# Patient Record
Sex: Female | Born: 1940 | Race: White | Hispanic: No | Smoking: Never smoker
Health system: Southern US, Community
[De-identification: ages and names within clinical notes are randomized; demographics above are authoritative.]

## PROBLEM LIST (undated history)

## (undated) DIAGNOSIS — M1990 Unspecified osteoarthritis, unspecified site: Secondary | ICD-10-CM

## (undated) DIAGNOSIS — R32 Unspecified urinary incontinence: Secondary | ICD-10-CM

## (undated) DIAGNOSIS — Z5189 Encounter for other specified aftercare: Secondary | ICD-10-CM

## (undated) DIAGNOSIS — Z9889 Other specified postprocedural states: Secondary | ICD-10-CM

## (undated) DIAGNOSIS — C801 Malignant (primary) neoplasm, unspecified: Secondary | ICD-10-CM

## (undated) DIAGNOSIS — B159 Hepatitis A without hepatic coma: Secondary | ICD-10-CM

## (undated) DIAGNOSIS — N390 Urinary tract infection, site not specified: Secondary | ICD-10-CM

## (undated) DIAGNOSIS — I10 Essential (primary) hypertension: Secondary | ICD-10-CM

## (undated) DIAGNOSIS — I219 Acute myocardial infarction, unspecified: Secondary | ICD-10-CM

## (undated) DIAGNOSIS — Z9049 Acquired absence of other specified parts of digestive tract: Secondary | ICD-10-CM

## (undated) DIAGNOSIS — K219 Gastro-esophageal reflux disease without esophagitis: Secondary | ICD-10-CM

## (undated) DIAGNOSIS — B019 Varicella without complication: Secondary | ICD-10-CM

## (undated) HISTORY — DX: Acute myocardial infarction, unspecified: I21.9

## (undated) HISTORY — PX: LIVER RESECTION: SHX1977

## (undated) HISTORY — DX: Essential (primary) hypertension: I10

## (undated) HISTORY — DX: Unspecified osteoarthritis, unspecified site: M19.90

## (undated) HISTORY — PX: SHOULDER SURGERY: SHX246

## (undated) HISTORY — DX: Hepatitis a without hepatic coma: B15.9

## (undated) HISTORY — PX: ABDOMINAL HYSTERECTOMY: SHX81

## (undated) HISTORY — PX: APPENDECTOMY: SHX54

## (undated) HISTORY — DX: Urinary tract infection, site not specified: N39.0

## (undated) HISTORY — PX: FOREIGN BODY REMOVAL: SHX962

## (undated) HISTORY — PX: SMALL INTESTINE SURGERY: SHX150

## (undated) HISTORY — PX: CHOLECYSTECTOMY: SHX55

## (undated) HISTORY — DX: Encounter for other specified aftercare: Z51.89

## (undated) HISTORY — DX: Varicella without complication: B01.9

## (undated) HISTORY — DX: Gastro-esophageal reflux disease without esophagitis: K21.9

## (undated) HISTORY — PX: CERVICAL SPINE SURGERY: SHX589

## (undated) HISTORY — DX: Unspecified urinary incontinence: R32

## (undated) HISTORY — PX: BREAST SURGERY: SHX581

---

## 2002-03-22 DIAGNOSIS — M2010 Hallux valgus (acquired), unspecified foot: Secondary | ICD-10-CM | POA: Insufficient documentation

## 2006-04-15 DIAGNOSIS — Z8505 Personal history of malignant neoplasm of liver: Secondary | ICD-10-CM | POA: Insufficient documentation

## 2006-04-15 DIAGNOSIS — M810 Age-related osteoporosis without current pathological fracture: Secondary | ICD-10-CM | POA: Insufficient documentation

## 2006-07-09 DIAGNOSIS — M545 Low back pain, unspecified: Secondary | ICD-10-CM | POA: Insufficient documentation

## 2006-07-09 DIAGNOSIS — K219 Gastro-esophageal reflux disease without esophagitis: Secondary | ICD-10-CM | POA: Insufficient documentation

## 2007-10-07 DIAGNOSIS — T192XXA Foreign body in vulva and vagina, initial encounter: Secondary | ICD-10-CM | POA: Insufficient documentation

## 2007-12-23 DIAGNOSIS — C7A019 Malignant carcinoid tumor of the small intestine, unspecified portion: Secondary | ICD-10-CM

## 2008-11-28 DIAGNOSIS — M79609 Pain in unspecified limb: Secondary | ICD-10-CM | POA: Insufficient documentation

## 2009-10-12 DIAGNOSIS — L304 Erythema intertrigo: Secondary | ICD-10-CM | POA: Insufficient documentation

## 2009-10-12 DIAGNOSIS — N390 Urinary tract infection, site not specified: Secondary | ICD-10-CM | POA: Insufficient documentation

## 2010-07-02 DIAGNOSIS — R221 Localized swelling, mass and lump, neck: Secondary | ICD-10-CM | POA: Insufficient documentation

## 2010-07-02 DIAGNOSIS — H8190 Unspecified disorder of vestibular function, unspecified ear: Secondary | ICD-10-CM | POA: Insufficient documentation

## 2010-07-02 DIAGNOSIS — G501 Atypical facial pain: Secondary | ICD-10-CM | POA: Insufficient documentation

## 2012-01-02 DIAGNOSIS — I2510 Atherosclerotic heart disease of native coronary artery without angina pectoris: Secondary | ICD-10-CM | POA: Insufficient documentation

## 2012-01-03 DIAGNOSIS — Z9889 Other specified postprocedural states: Secondary | ICD-10-CM | POA: Insufficient documentation

## 2012-06-12 DIAGNOSIS — T84099A Other mechanical complication of unspecified internal joint prosthesis, initial encounter: Secondary | ICD-10-CM | POA: Insufficient documentation

## 2015-08-10 DIAGNOSIS — M5412 Radiculopathy, cervical region: Secondary | ICD-10-CM | POA: Insufficient documentation

## 2015-09-11 DIAGNOSIS — F411 Generalized anxiety disorder: Secondary | ICD-10-CM | POA: Insufficient documentation

## 2015-09-26 DIAGNOSIS — F4323 Adjustment disorder with mixed anxiety and depressed mood: Secondary | ICD-10-CM | POA: Diagnosis not present

## 2015-09-27 DIAGNOSIS — K5289 Other specified noninfective gastroenteritis and colitis: Secondary | ICD-10-CM | POA: Diagnosis not present

## 2015-09-27 DIAGNOSIS — D490 Neoplasm of unspecified behavior of digestive system: Secondary | ICD-10-CM | POA: Diagnosis not present

## 2015-09-27 DIAGNOSIS — R1031 Right lower quadrant pain: Secondary | ICD-10-CM | POA: Diagnosis not present

## 2015-09-27 DIAGNOSIS — R1011 Right upper quadrant pain: Secondary | ICD-10-CM | POA: Diagnosis not present

## 2015-09-27 DIAGNOSIS — R142 Eructation: Secondary | ICD-10-CM | POA: Diagnosis not present

## 2015-09-27 DIAGNOSIS — M542 Cervicalgia: Secondary | ICD-10-CM | POA: Diagnosis not present

## 2015-09-29 DIAGNOSIS — Z8589 Personal history of malignant neoplasm of other organs and systems: Secondary | ICD-10-CM | POA: Diagnosis not present

## 2015-09-29 DIAGNOSIS — R1031 Right lower quadrant pain: Secondary | ICD-10-CM | POA: Diagnosis not present

## 2015-09-29 DIAGNOSIS — S72001A Fracture of unspecified part of neck of right femur, initial encounter for closed fracture: Secondary | ICD-10-CM | POA: Diagnosis not present

## 2015-09-29 DIAGNOSIS — Z9049 Acquired absence of other specified parts of digestive tract: Secondary | ICD-10-CM | POA: Diagnosis not present

## 2015-09-29 DIAGNOSIS — R197 Diarrhea, unspecified: Secondary | ICD-10-CM | POA: Diagnosis not present

## 2015-09-29 DIAGNOSIS — R1011 Right upper quadrant pain: Secondary | ICD-10-CM | POA: Diagnosis not present

## 2015-09-29 DIAGNOSIS — R142 Eructation: Secondary | ICD-10-CM | POA: Diagnosis not present

## 2015-10-03 DIAGNOSIS — F4323 Adjustment disorder with mixed anxiety and depressed mood: Secondary | ICD-10-CM | POA: Diagnosis not present

## 2015-10-03 DIAGNOSIS — R1031 Right lower quadrant pain: Secondary | ICD-10-CM | POA: Diagnosis not present

## 2015-10-03 DIAGNOSIS — R142 Eructation: Secondary | ICD-10-CM | POA: Diagnosis not present

## 2015-10-03 DIAGNOSIS — R1011 Right upper quadrant pain: Secondary | ICD-10-CM | POA: Diagnosis not present

## 2015-10-03 DIAGNOSIS — K5289 Other specified noninfective gastroenteritis and colitis: Secondary | ICD-10-CM | POA: Diagnosis not present

## 2015-10-03 DIAGNOSIS — D490 Neoplasm of unspecified behavior of digestive system: Secondary | ICD-10-CM | POA: Diagnosis not present

## 2015-10-10 DIAGNOSIS — F4323 Adjustment disorder with mixed anxiety and depressed mood: Secondary | ICD-10-CM | POA: Diagnosis not present

## 2015-10-14 DIAGNOSIS — B9689 Other specified bacterial agents as the cause of diseases classified elsewhere: Secondary | ICD-10-CM | POA: Diagnosis not present

## 2015-10-14 DIAGNOSIS — R350 Frequency of micturition: Secondary | ICD-10-CM | POA: Diagnosis not present

## 2015-10-14 DIAGNOSIS — N760 Acute vaginitis: Secondary | ICD-10-CM | POA: Diagnosis not present

## 2015-10-14 DIAGNOSIS — N3000 Acute cystitis without hematuria: Secondary | ICD-10-CM | POA: Diagnosis not present

## 2015-10-17 DIAGNOSIS — F4323 Adjustment disorder with mixed anxiety and depressed mood: Secondary | ICD-10-CM | POA: Diagnosis not present

## 2015-10-17 DIAGNOSIS — M25512 Pain in left shoulder: Secondary | ICD-10-CM | POA: Diagnosis not present

## 2015-10-17 DIAGNOSIS — G8929 Other chronic pain: Secondary | ICD-10-CM | POA: Diagnosis not present

## 2015-10-24 DIAGNOSIS — F4323 Adjustment disorder with mixed anxiety and depressed mood: Secondary | ICD-10-CM | POA: Diagnosis not present

## 2015-10-31 DIAGNOSIS — F4323 Adjustment disorder with mixed anxiety and depressed mood: Secondary | ICD-10-CM | POA: Diagnosis not present

## 2015-11-02 DIAGNOSIS — M25512 Pain in left shoulder: Secondary | ICD-10-CM | POA: Diagnosis not present

## 2015-11-06 DIAGNOSIS — M25512 Pain in left shoulder: Secondary | ICD-10-CM | POA: Diagnosis not present

## 2015-11-07 DIAGNOSIS — F4323 Adjustment disorder with mixed anxiety and depressed mood: Secondary | ICD-10-CM | POA: Diagnosis not present

## 2015-11-08 DIAGNOSIS — M25512 Pain in left shoulder: Secondary | ICD-10-CM | POA: Diagnosis not present

## 2015-11-09 DIAGNOSIS — M25512 Pain in left shoulder: Secondary | ICD-10-CM | POA: Diagnosis not present

## 2015-11-10 DIAGNOSIS — M25512 Pain in left shoulder: Secondary | ICD-10-CM | POA: Diagnosis not present

## 2015-11-12 DIAGNOSIS — N390 Urinary tract infection, site not specified: Secondary | ICD-10-CM | POA: Diagnosis not present

## 2015-11-12 DIAGNOSIS — N3000 Acute cystitis without hematuria: Secondary | ICD-10-CM | POA: Diagnosis not present

## 2015-11-12 DIAGNOSIS — B9689 Other specified bacterial agents as the cause of diseases classified elsewhere: Secondary | ICD-10-CM | POA: Diagnosis not present

## 2015-11-13 DIAGNOSIS — M25512 Pain in left shoulder: Secondary | ICD-10-CM | POA: Diagnosis not present

## 2015-11-15 DIAGNOSIS — R180 Malignant ascites: Secondary | ICD-10-CM | POA: Diagnosis not present

## 2015-11-15 DIAGNOSIS — Z96641 Presence of right artificial hip joint: Secondary | ICD-10-CM | POA: Diagnosis present

## 2015-11-15 DIAGNOSIS — Z90710 Acquired absence of both cervix and uterus: Secondary | ICD-10-CM | POA: Diagnosis not present

## 2015-11-15 DIAGNOSIS — I2510 Atherosclerotic heart disease of native coronary artery without angina pectoris: Secondary | ICD-10-CM | POA: Diagnosis not present

## 2015-11-15 DIAGNOSIS — Z96612 Presence of left artificial shoulder joint: Secondary | ICD-10-CM | POA: Diagnosis present

## 2015-11-15 DIAGNOSIS — Z8249 Family history of ischemic heart disease and other diseases of the circulatory system: Secondary | ICD-10-CM | POA: Diagnosis not present

## 2015-11-15 DIAGNOSIS — Z882 Allergy status to sulfonamides status: Secondary | ICD-10-CM | POA: Diagnosis not present

## 2015-11-15 DIAGNOSIS — I252 Old myocardial infarction: Secondary | ICD-10-CM | POA: Diagnosis not present

## 2015-11-15 DIAGNOSIS — R1110 Vomiting, unspecified: Secondary | ICD-10-CM | POA: Diagnosis not present

## 2015-11-15 DIAGNOSIS — E860 Dehydration: Secondary | ICD-10-CM | POA: Diagnosis not present

## 2015-11-15 DIAGNOSIS — Z9049 Acquired absence of other specified parts of digestive tract: Secondary | ICD-10-CM | POA: Diagnosis not present

## 2015-11-15 DIAGNOSIS — Z85060 Personal history of malignant carcinoid tumor of small intestine: Secondary | ICD-10-CM | POA: Diagnosis not present

## 2015-11-15 DIAGNOSIS — C800 Disseminated malignant neoplasm, unspecified: Secondary | ICD-10-CM | POA: Diagnosis not present

## 2015-11-15 DIAGNOSIS — Z792 Long term (current) use of antibiotics: Secondary | ICD-10-CM | POA: Diagnosis not present

## 2015-11-15 DIAGNOSIS — Z7982 Long term (current) use of aspirin: Secondary | ICD-10-CM | POA: Diagnosis not present

## 2015-11-15 DIAGNOSIS — K7689 Other specified diseases of liver: Secondary | ICD-10-CM | POA: Diagnosis not present

## 2015-11-15 DIAGNOSIS — K565 Intestinal adhesions [bands] with obstruction (postprocedural) (postinfection): Secondary | ICD-10-CM | POA: Diagnosis present

## 2015-11-15 DIAGNOSIS — K760 Fatty (change of) liver, not elsewhere classified: Secondary | ICD-10-CM | POA: Diagnosis not present

## 2015-11-15 DIAGNOSIS — K5669 Other intestinal obstruction: Secondary | ICD-10-CM | POA: Diagnosis not present

## 2015-11-15 DIAGNOSIS — K5660 Unspecified intestinal obstruction: Secondary | ICD-10-CM | POA: Diagnosis not present

## 2015-11-15 DIAGNOSIS — R197 Diarrhea, unspecified: Secondary | ICD-10-CM | POA: Diagnosis not present

## 2015-11-15 DIAGNOSIS — Z79899 Other long term (current) drug therapy: Secondary | ICD-10-CM | POA: Diagnosis not present

## 2015-11-15 DIAGNOSIS — I10 Essential (primary) hypertension: Secondary | ICD-10-CM | POA: Diagnosis not present

## 2015-11-15 DIAGNOSIS — Z825 Family history of asthma and other chronic lower respiratory diseases: Secondary | ICD-10-CM | POA: Diagnosis not present

## 2015-11-15 DIAGNOSIS — Z8505 Personal history of malignant neoplasm of liver: Secondary | ICD-10-CM | POA: Diagnosis not present

## 2015-11-15 DIAGNOSIS — D72829 Elevated white blood cell count, unspecified: Secondary | ICD-10-CM | POA: Diagnosis not present

## 2015-11-20 DIAGNOSIS — I252 Old myocardial infarction: Secondary | ICD-10-CM | POA: Insufficient documentation

## 2015-11-21 DIAGNOSIS — F4323 Adjustment disorder with mixed anxiety and depressed mood: Secondary | ICD-10-CM | POA: Diagnosis not present

## 2015-11-22 DIAGNOSIS — Z1211 Encounter for screening for malignant neoplasm of colon: Secondary | ICD-10-CM | POA: Diagnosis not present

## 2015-11-22 DIAGNOSIS — I10 Essential (primary) hypertension: Secondary | ICD-10-CM | POA: Diagnosis not present

## 2015-11-22 DIAGNOSIS — R5383 Other fatigue: Secondary | ICD-10-CM | POA: Diagnosis not present

## 2015-11-22 DIAGNOSIS — Z1212 Encounter for screening for malignant neoplasm of rectum: Secondary | ICD-10-CM | POA: Diagnosis not present

## 2015-11-22 DIAGNOSIS — M25512 Pain in left shoulder: Secondary | ICD-10-CM | POA: Diagnosis not present

## 2015-11-22 DIAGNOSIS — G8929 Other chronic pain: Secondary | ICD-10-CM | POA: Diagnosis not present

## 2015-11-22 DIAGNOSIS — Z0000 Encounter for general adult medical examination without abnormal findings: Secondary | ICD-10-CM | POA: Diagnosis not present

## 2015-11-22 DIAGNOSIS — Z09 Encounter for follow-up examination after completed treatment for conditions other than malignant neoplasm: Secondary | ICD-10-CM | POA: Diagnosis not present

## 2015-11-28 DIAGNOSIS — F4323 Adjustment disorder with mixed anxiety and depressed mood: Secondary | ICD-10-CM | POA: Diagnosis not present

## 2015-11-28 DIAGNOSIS — M25512 Pain in left shoulder: Secondary | ICD-10-CM | POA: Diagnosis not present

## 2015-11-28 DIAGNOSIS — G243 Spasmodic torticollis: Secondary | ICD-10-CM | POA: Diagnosis not present

## 2015-11-28 DIAGNOSIS — Y92018 Other place in single-family (private) house as the place of occurrence of the external cause: Secondary | ICD-10-CM | POA: Diagnosis not present

## 2015-11-28 DIAGNOSIS — W108XXA Fall (on) (from) other stairs and steps, initial encounter: Secondary | ICD-10-CM | POA: Diagnosis not present

## 2015-11-28 DIAGNOSIS — Z96612 Presence of left artificial shoulder joint: Secondary | ICD-10-CM | POA: Diagnosis not present

## 2015-11-29 DIAGNOSIS — M25512 Pain in left shoulder: Secondary | ICD-10-CM | POA: Diagnosis not present

## 2015-11-29 DIAGNOSIS — I10 Essential (primary) hypertension: Secondary | ICD-10-CM | POA: Diagnosis not present

## 2015-11-29 DIAGNOSIS — M545 Low back pain: Secondary | ICD-10-CM | POA: Diagnosis not present

## 2015-11-29 DIAGNOSIS — M810 Age-related osteoporosis without current pathological fracture: Secondary | ICD-10-CM | POA: Diagnosis not present

## 2015-11-29 DIAGNOSIS — G8929 Other chronic pain: Secondary | ICD-10-CM | POA: Diagnosis not present

## 2015-11-30 DIAGNOSIS — R5383 Other fatigue: Secondary | ICD-10-CM | POA: Diagnosis not present

## 2015-11-30 DIAGNOSIS — M542 Cervicalgia: Secondary | ICD-10-CM | POA: Diagnosis not present

## 2015-11-30 DIAGNOSIS — I10 Essential (primary) hypertension: Secondary | ICD-10-CM | POA: Diagnosis not present

## 2015-11-30 DIAGNOSIS — M810 Age-related osteoporosis without current pathological fracture: Secondary | ICD-10-CM | POA: Diagnosis not present

## 2015-12-02 DIAGNOSIS — Z9049 Acquired absence of other specified parts of digestive tract: Secondary | ICD-10-CM | POA: Diagnosis not present

## 2015-12-02 DIAGNOSIS — Z8719 Personal history of other diseases of the digestive system: Secondary | ICD-10-CM | POA: Diagnosis not present

## 2015-12-02 DIAGNOSIS — R1011 Right upper quadrant pain: Secondary | ICD-10-CM | POA: Diagnosis not present

## 2015-12-02 DIAGNOSIS — R630 Anorexia: Secondary | ICD-10-CM | POA: Diagnosis not present

## 2015-12-04 DIAGNOSIS — L905 Scar conditions and fibrosis of skin: Secondary | ICD-10-CM | POA: Diagnosis not present

## 2015-12-05 DIAGNOSIS — C7A019 Malignant carcinoid tumor of the small intestine, unspecified portion: Secondary | ICD-10-CM | POA: Diagnosis not present

## 2015-12-05 DIAGNOSIS — I491 Atrial premature depolarization: Secondary | ICD-10-CM | POA: Diagnosis not present

## 2015-12-05 DIAGNOSIS — F411 Generalized anxiety disorder: Secondary | ICD-10-CM | POA: Diagnosis not present

## 2015-12-05 DIAGNOSIS — K5669 Other intestinal obstruction: Secondary | ICD-10-CM | POA: Diagnosis not present

## 2015-12-05 DIAGNOSIS — Z09 Encounter for follow-up examination after completed treatment for conditions other than malignant neoplasm: Secondary | ICD-10-CM | POA: Diagnosis not present

## 2015-12-05 DIAGNOSIS — M810 Age-related osteoporosis without current pathological fracture: Secondary | ICD-10-CM | POA: Diagnosis not present

## 2015-12-06 DIAGNOSIS — Z01812 Encounter for preprocedural laboratory examination: Secondary | ICD-10-CM | POA: Diagnosis not present

## 2015-12-11 DIAGNOSIS — Z01810 Encounter for preprocedural cardiovascular examination: Secondary | ICD-10-CM | POA: Diagnosis not present

## 2015-12-11 DIAGNOSIS — R000 Tachycardia, unspecified: Secondary | ICD-10-CM | POA: Diagnosis not present

## 2015-12-11 DIAGNOSIS — R0789 Other chest pain: Secondary | ICD-10-CM | POA: Diagnosis not present

## 2015-12-11 DIAGNOSIS — I10 Essential (primary) hypertension: Secondary | ICD-10-CM | POA: Diagnosis not present

## 2015-12-12 DIAGNOSIS — R079 Chest pain, unspecified: Secondary | ICD-10-CM | POA: Diagnosis not present

## 2015-12-12 DIAGNOSIS — F4323 Adjustment disorder with mixed anxiety and depressed mood: Secondary | ICD-10-CM | POA: Diagnosis not present

## 2015-12-18 DIAGNOSIS — M25812 Other specified joint disorders, left shoulder: Secondary | ICD-10-CM | POA: Diagnosis not present

## 2015-12-18 DIAGNOSIS — Z471 Aftercare following joint replacement surgery: Secondary | ICD-10-CM | POA: Diagnosis not present

## 2015-12-18 DIAGNOSIS — Z96612 Presence of left artificial shoulder joint: Secondary | ICD-10-CM | POA: Diagnosis not present

## 2015-12-20 DIAGNOSIS — F411 Generalized anxiety disorder: Secondary | ICD-10-CM | POA: Diagnosis not present

## 2015-12-20 DIAGNOSIS — M25512 Pain in left shoulder: Secondary | ICD-10-CM | POA: Diagnosis not present

## 2015-12-20 DIAGNOSIS — F4323 Adjustment disorder with mixed anxiety and depressed mood: Secondary | ICD-10-CM | POA: Diagnosis not present

## 2015-12-20 DIAGNOSIS — G8929 Other chronic pain: Secondary | ICD-10-CM | POA: Diagnosis not present

## 2015-12-20 DIAGNOSIS — I491 Atrial premature depolarization: Secondary | ICD-10-CM | POA: Diagnosis not present

## 2015-12-20 DIAGNOSIS — I10 Essential (primary) hypertension: Secondary | ICD-10-CM | POA: Diagnosis not present

## 2015-12-21 DIAGNOSIS — Z713 Dietary counseling and surveillance: Secondary | ICD-10-CM | POA: Diagnosis not present

## 2015-12-21 DIAGNOSIS — M25512 Pain in left shoulder: Secondary | ICD-10-CM | POA: Diagnosis not present

## 2015-12-21 DIAGNOSIS — Z96612 Presence of left artificial shoulder joint: Secondary | ICD-10-CM | POA: Diagnosis not present

## 2015-12-26 DIAGNOSIS — F4323 Adjustment disorder with mixed anxiety and depressed mood: Secondary | ICD-10-CM | POA: Diagnosis not present

## 2015-12-26 DIAGNOSIS — C179 Malignant neoplasm of small intestine, unspecified: Secondary | ICD-10-CM | POA: Diagnosis not present

## 2016-01-09 DIAGNOSIS — F4323 Adjustment disorder with mixed anxiety and depressed mood: Secondary | ICD-10-CM | POA: Diagnosis not present

## 2016-01-18 DIAGNOSIS — I491 Atrial premature depolarization: Secondary | ICD-10-CM | POA: Diagnosis not present

## 2016-01-18 DIAGNOSIS — R0789 Other chest pain: Secondary | ICD-10-CM | POA: Diagnosis not present

## 2016-01-18 DIAGNOSIS — I493 Ventricular premature depolarization: Secondary | ICD-10-CM | POA: Diagnosis not present

## 2016-01-18 DIAGNOSIS — I10 Essential (primary) hypertension: Secondary | ICD-10-CM | POA: Diagnosis not present

## 2016-02-01 DIAGNOSIS — E2839 Other primary ovarian failure: Secondary | ICD-10-CM | POA: Diagnosis not present

## 2016-02-01 DIAGNOSIS — H524 Presbyopia: Secondary | ICD-10-CM | POA: Diagnosis not present

## 2016-02-01 DIAGNOSIS — H5213 Myopia, bilateral: Secondary | ICD-10-CM | POA: Diagnosis not present

## 2016-02-01 DIAGNOSIS — M25512 Pain in left shoulder: Secondary | ICD-10-CM | POA: Diagnosis not present

## 2016-02-01 DIAGNOSIS — Z791 Long term (current) use of non-steroidal anti-inflammatories (NSAID): Secondary | ICD-10-CM | POA: Diagnosis not present

## 2016-02-01 DIAGNOSIS — K219 Gastro-esophageal reflux disease without esophagitis: Secondary | ICD-10-CM | POA: Diagnosis not present

## 2016-02-01 DIAGNOSIS — I4891 Unspecified atrial fibrillation: Secondary | ICD-10-CM | POA: Diagnosis not present

## 2016-02-01 DIAGNOSIS — M810 Age-related osteoporosis without current pathological fracture: Secondary | ICD-10-CM | POA: Diagnosis not present

## 2016-02-01 DIAGNOSIS — I10 Essential (primary) hypertension: Secondary | ICD-10-CM | POA: Diagnosis not present

## 2016-02-01 DIAGNOSIS — Z8505 Personal history of malignant neoplasm of liver: Secondary | ICD-10-CM | POA: Diagnosis not present

## 2016-02-01 DIAGNOSIS — Z85068 Personal history of other malignant neoplasm of small intestine: Secondary | ICD-10-CM | POA: Diagnosis not present

## 2016-02-01 DIAGNOSIS — G8929 Other chronic pain: Secondary | ICD-10-CM | POA: Diagnosis not present

## 2016-02-07 ENCOUNTER — Encounter (HOSPITAL_COMMUNITY): Payer: Self-pay | Admitting: Emergency Medicine

## 2016-02-07 ENCOUNTER — Ambulatory Visit (HOSPITAL_COMMUNITY)
Admission: EM | Admit: 2016-02-07 | Discharge: 2016-02-07 | Disposition: A | Discharge status: Home / Self Care | Payer: Medicare Other | Attending: Emergency Medicine | Admitting: Emergency Medicine

## 2016-02-07 DIAGNOSIS — W57XXXA Bitten or stung by nonvenomous insect and other nonvenomous arthropods, initial encounter: Secondary | ICD-10-CM

## 2016-02-07 DIAGNOSIS — T148 Other injury of unspecified body region: Secondary | ICD-10-CM

## 2016-02-07 HISTORY — DX: Other specified postprocedural states: Z98.890

## 2016-02-07 HISTORY — DX: Malignant (primary) neoplasm, unspecified: C80.1

## 2016-02-07 HISTORY — DX: Acquired absence of other specified parts of digestive tract: Z90.49

## 2016-02-07 MED ORDER — TRIAMCINOLONE ACETONIDE 0.1 % EX CREA: 1.0000 "application " | TOPICAL_CREAM | Freq: Two times a day (BID) | CUTANEOUS | Status: DC

## 2016-02-07 NOTE — ED Notes (Signed)
The patient presented to the [facility] with a complaint of a possible insect bite or rash on the heel of her right foot that started today.

## 2016-02-07 NOTE — ED Provider Notes (Signed)
CSN: [id number]     Arrival date & time 02/07/16  1912 History   First MD Initiated Contact with Patient 02/07/16 2007     Chief Complaint  Patient presents with  . Rash   (Consider location/radiation/quality/duration/timing/severity/associated sxs/prior Treatment) HPI Comments: 75 year old female with this morning and noticed that there were small papules to the right heel. It is surrounded by blotchy erythema. There is no pain, burning, itching, tenderness or other associated discomfort. She denies systemic symptoms.   Past Medical History  Diagnosis Date  . Cancer ([place])   . H/O resection of liver   . H/O shoulder surgery    Past Surgical History  Procedure Laterality Date  . Shoulder surgery     History reviewed. No pertinent family history. Social History  Substance Use Topics  . Smoking status: Never Smoker   . Smokeless tobacco: None  . Alcohol Use: No   OB History    No data available     Review of Systems  Constitutional: Negative.   HENT: Negative.   Respiratory: Negative.   Skin:       As per history of present illness  Neurological: Negative.     Allergies  Nubain  Home Medications   Prior to Admission medications   Medication Sig Start Date End Date Taking? Authorizing Provider  aspirin 81 MG tablet Take 81 mg by mouth daily.   Yes Historical Provider, MD  metoprolol (LOPRESSOR) 50 MG tablet Take 50 mg by mouth 2 (two) times daily.   Yes Historical Provider, MD  triamcinolone cream (KENALOG) 0.1 % Apply 1 application topically 2 (two) times daily. 02/07/16   Janne Napoleon, NP   Meds Ordered and Administered this Visit  Medications - No data to display  BP 152/92 mmHg  Pulse 89  Temp(Src) 97.6 F (36.4 C) (Oral)  Resp 18  SpO2 94% No data found.   Physical Exam  Constitutional: She is oriented to person, place, and time. She appears well-developed and well-nourished. No distress.  Eyes: EOM are normal.  Neck: Normal range of motion. Neck  supple.  Cardiovascular: Normal rate.   Pulmonary/Chest: Effort normal. No respiratory distress.  Musculoskeletal: She exhibits no edema.  Neurological: She is alert and oriented to person, place, and time. She exhibits normal muscle tone.  Skin: Skin is warm and dry.  The right lateral and posterior ankle and heel there is blotchy erythema. Within this area of erythema there are proximally 7 papules adjacent to each other in a curvilinear pattern. No vesicles. Nontender. No lymphangitis. No smooth cutaneous erythema consistent with cellulitis.  Psychiatric: She has a normal mood and affect.  Nursing note and vitals reviewed.   ED Course  Procedures (including critical care time)  Labs Review Labs Reviewed - No data to display  Imaging Review No results found.   Visual Acuity Review  Right Eye Distance:   Left Eye Distance:   Bilateral Distance:    Right Eye Near:   Left Eye Near:    Bilateral Near:         MDM   1. Insect bite    Meds ordered this encounter  Medications  . metoprolol (LOPRESSOR) 50 MG tablet    Sig: Take 50 mg by mouth 2 (two) times daily.  Marland Kitchen aspirin 81 MG tablet    Sig: Take 81 mg by mouth daily.  Marland Kitchen triamcinolone cream (KENALOG) 0.1 %    Sig: Apply 1 application topically 2 (two) times daily.    Dispense:  30 g    Refill:  0    Order Specific Question:  Supervising Provider    Answer:  Melony Overly [id number]   Watch for signs of infection. Fever, pain, increased redness, red streaks seek medical attention promptly. Triamcinolone cream twice a day. Apply cold compresses for any itching. For pain, tingling, burning or numbness and increase or development of /vesicles sick medical attention promptly  Differential includes zoster, infection. These are less likely at this time. It is noted that to photographs were taken however did not transit through haiku and attach to the chart.  Janne Napoleon, NP 02/07/16 2032

## 2016-02-20 DIAGNOSIS — I10 Essential (primary) hypertension: Secondary | ICD-10-CM | POA: Diagnosis not present

## 2016-02-20 DIAGNOSIS — I499 Cardiac arrhythmia, unspecified: Secondary | ICD-10-CM | POA: Diagnosis not present

## 2016-02-20 DIAGNOSIS — R0789 Other chest pain: Secondary | ICD-10-CM | POA: Diagnosis not present

## 2016-02-21 DIAGNOSIS — R0789 Other chest pain: Secondary | ICD-10-CM | POA: Diagnosis not present

## 2016-02-22 DIAGNOSIS — I499 Cardiac arrhythmia, unspecified: Secondary | ICD-10-CM | POA: Diagnosis not present

## 2016-02-22 DIAGNOSIS — R0789 Other chest pain: Secondary | ICD-10-CM | POA: Diagnosis not present

## 2016-02-22 DIAGNOSIS — I10 Essential (primary) hypertension: Secondary | ICD-10-CM | POA: Diagnosis not present

## 2016-03-01 DIAGNOSIS — I10 Essential (primary) hypertension: Secondary | ICD-10-CM | POA: Diagnosis not present

## 2016-03-01 DIAGNOSIS — I499 Cardiac arrhythmia, unspecified: Secondary | ICD-10-CM | POA: Diagnosis not present

## 2016-03-01 DIAGNOSIS — R0789 Other chest pain: Secondary | ICD-10-CM | POA: Diagnosis not present

## 2016-03-12 DIAGNOSIS — H2513 Age-related nuclear cataract, bilateral: Secondary | ICD-10-CM | POA: Diagnosis not present

## 2016-03-12 DIAGNOSIS — H353132 Nonexudative age-related macular degeneration, bilateral, intermediate dry stage: Secondary | ICD-10-CM | POA: Diagnosis not present

## 2016-03-12 DIAGNOSIS — H43813 Vitreous degeneration, bilateral: Secondary | ICD-10-CM | POA: Diagnosis not present

## 2016-03-29 DIAGNOSIS — R1031 Right lower quadrant pain: Secondary | ICD-10-CM | POA: Diagnosis not present

## 2016-03-29 DIAGNOSIS — K9189 Other postprocedural complications and disorders of digestive system: Secondary | ICD-10-CM | POA: Diagnosis not present

## 2016-03-29 DIAGNOSIS — Z85060 Personal history of malignant carcinoid tumor of small intestine: Secondary | ICD-10-CM | POA: Diagnosis not present

## 2016-04-01 DIAGNOSIS — M25519 Pain in unspecified shoulder: Secondary | ICD-10-CM | POA: Diagnosis not present

## 2016-04-01 DIAGNOSIS — G894 Chronic pain syndrome: Secondary | ICD-10-CM | POA: Diagnosis not present

## 2016-04-01 DIAGNOSIS — Z79899 Other long term (current) drug therapy: Secondary | ICD-10-CM | POA: Diagnosis not present

## 2016-04-01 DIAGNOSIS — Z79891 Long term (current) use of opiate analgesic: Secondary | ICD-10-CM | POA: Diagnosis not present

## 2016-04-01 DIAGNOSIS — M25559 Pain in unspecified hip: Secondary | ICD-10-CM | POA: Diagnosis not present

## 2016-04-02 DIAGNOSIS — R1031 Right lower quadrant pain: Secondary | ICD-10-CM | POA: Diagnosis not present

## 2016-04-02 DIAGNOSIS — M810 Age-related osteoporosis without current pathological fracture: Secondary | ICD-10-CM | POA: Diagnosis not present

## 2016-04-02 DIAGNOSIS — E2839 Other primary ovarian failure: Secondary | ICD-10-CM | POA: Diagnosis not present

## 2016-04-11 DIAGNOSIS — H353132 Nonexudative age-related macular degeneration, bilateral, intermediate dry stage: Secondary | ICD-10-CM | POA: Diagnosis not present

## 2016-04-29 DIAGNOSIS — Z79891 Long term (current) use of opiate analgesic: Secondary | ICD-10-CM | POA: Diagnosis not present

## 2016-04-29 DIAGNOSIS — M25519 Pain in unspecified shoulder: Secondary | ICD-10-CM | POA: Diagnosis not present

## 2016-04-29 DIAGNOSIS — Z79899 Other long term (current) drug therapy: Secondary | ICD-10-CM | POA: Diagnosis not present

## 2016-04-29 DIAGNOSIS — M25559 Pain in unspecified hip: Secondary | ICD-10-CM | POA: Diagnosis not present

## 2016-04-29 DIAGNOSIS — G894 Chronic pain syndrome: Secondary | ICD-10-CM | POA: Diagnosis not present

## 2016-05-06 DIAGNOSIS — Z85060 Personal history of malignant carcinoid tumor of small intestine: Secondary | ICD-10-CM | POA: Diagnosis not present

## 2016-05-06 DIAGNOSIS — R197 Diarrhea, unspecified: Secondary | ICD-10-CM | POA: Diagnosis not present

## 2016-05-28 DIAGNOSIS — M25519 Pain in unspecified shoulder: Secondary | ICD-10-CM | POA: Diagnosis not present

## 2016-05-28 DIAGNOSIS — G894 Chronic pain syndrome: Secondary | ICD-10-CM | POA: Diagnosis not present

## 2016-05-28 DIAGNOSIS — Z79891 Long term (current) use of opiate analgesic: Secondary | ICD-10-CM | POA: Diagnosis not present

## 2016-05-28 DIAGNOSIS — Z79899 Other long term (current) drug therapy: Secondary | ICD-10-CM | POA: Diagnosis not present

## 2016-05-28 DIAGNOSIS — M25559 Pain in unspecified hip: Secondary | ICD-10-CM | POA: Diagnosis not present

## 2016-06-03 DIAGNOSIS — I491 Atrial premature depolarization: Secondary | ICD-10-CM | POA: Diagnosis not present

## 2016-06-03 DIAGNOSIS — B029 Zoster without complications: Secondary | ICD-10-CM | POA: Diagnosis not present

## 2016-06-03 DIAGNOSIS — Z85060 Personal history of malignant carcinoid tumor of small intestine: Secondary | ICD-10-CM | POA: Diagnosis not present

## 2016-06-03 DIAGNOSIS — K219 Gastro-esophageal reflux disease without esophagitis: Secondary | ICD-10-CM | POA: Diagnosis not present

## 2016-06-03 DIAGNOSIS — M25512 Pain in left shoulder: Secondary | ICD-10-CM | POA: Diagnosis not present

## 2016-06-03 DIAGNOSIS — I10 Essential (primary) hypertension: Secondary | ICD-10-CM | POA: Diagnosis not present

## 2016-06-03 DIAGNOSIS — M810 Age-related osteoporosis without current pathological fracture: Secondary | ICD-10-CM | POA: Diagnosis not present

## 2016-06-03 DIAGNOSIS — Z23 Encounter for immunization: Secondary | ICD-10-CM | POA: Diagnosis not present

## 2016-07-02 DIAGNOSIS — Z79899 Other long term (current) drug therapy: Secondary | ICD-10-CM | POA: Diagnosis not present

## 2016-07-02 DIAGNOSIS — M25519 Pain in unspecified shoulder: Secondary | ICD-10-CM | POA: Diagnosis not present

## 2016-07-02 DIAGNOSIS — M542 Cervicalgia: Secondary | ICD-10-CM | POA: Diagnosis not present

## 2016-07-02 DIAGNOSIS — Z79891 Long term (current) use of opiate analgesic: Secondary | ICD-10-CM | POA: Diagnosis not present

## 2016-07-02 DIAGNOSIS — G894 Chronic pain syndrome: Secondary | ICD-10-CM | POA: Diagnosis not present

## 2016-07-02 DIAGNOSIS — M25559 Pain in unspecified hip: Secondary | ICD-10-CM | POA: Diagnosis not present

## 2016-07-09 DIAGNOSIS — M542 Cervicalgia: Secondary | ICD-10-CM | POA: Diagnosis not present

## 2016-07-11 DIAGNOSIS — M542 Cervicalgia: Secondary | ICD-10-CM | POA: Diagnosis not present

## 2016-07-16 DIAGNOSIS — M542 Cervicalgia: Secondary | ICD-10-CM | POA: Diagnosis not present

## 2016-07-18 DIAGNOSIS — M542 Cervicalgia: Secondary | ICD-10-CM | POA: Diagnosis not present

## 2016-07-23 DIAGNOSIS — M542 Cervicalgia: Secondary | ICD-10-CM | POA: Diagnosis not present

## 2016-07-25 DIAGNOSIS — M542 Cervicalgia: Secondary | ICD-10-CM | POA: Diagnosis not present

## 2016-07-30 ENCOUNTER — Other Ambulatory Visit: Payer: Self-pay | Admitting: Physician Assistant

## 2016-07-30 ENCOUNTER — Ambulatory Visit
Admission: RE | Admit: 2016-07-30 | Discharge: 2016-07-30 | Disposition: A | Discharge status: Home / Self Care | Payer: Medicare Other | Source: Ambulatory Visit | Attending: Physician Assistant | Admitting: Physician Assistant

## 2016-07-30 DIAGNOSIS — M25519 Pain in unspecified shoulder: Secondary | ICD-10-CM

## 2016-07-30 DIAGNOSIS — M542 Cervicalgia: Secondary | ICD-10-CM | POA: Diagnosis not present

## 2016-07-30 DIAGNOSIS — G894 Chronic pain syndrome: Secondary | ICD-10-CM

## 2016-07-30 DIAGNOSIS — Z79899 Other long term (current) drug therapy: Secondary | ICD-10-CM | POA: Diagnosis not present

## 2016-07-30 DIAGNOSIS — M79603 Pain in arm, unspecified: Secondary | ICD-10-CM | POA: Diagnosis not present

## 2016-07-30 DIAGNOSIS — Z79891 Long term (current) use of opiate analgesic: Secondary | ICD-10-CM | POA: Diagnosis not present

## 2016-08-14 DIAGNOSIS — R002 Palpitations: Secondary | ICD-10-CM | POA: Diagnosis not present

## 2016-08-14 DIAGNOSIS — I10 Essential (primary) hypertension: Secondary | ICD-10-CM | POA: Diagnosis not present

## 2016-09-03 DIAGNOSIS — M542 Cervicalgia: Secondary | ICD-10-CM | POA: Diagnosis not present

## 2016-09-03 DIAGNOSIS — G894 Chronic pain syndrome: Secondary | ICD-10-CM | POA: Diagnosis not present

## 2016-09-03 DIAGNOSIS — M25519 Pain in unspecified shoulder: Secondary | ICD-10-CM | POA: Diagnosis not present

## 2016-09-18 ENCOUNTER — Telehealth: Payer: Self-pay | Admitting: *Deleted

## 2016-09-18 NOTE — Telephone Encounter (Signed)
Oncology Nurse Navigator Documentation  Oncology Nurse Navigator Flowsheets 09/18/2016  Navigator Location [facility]  Referral date to RadOnc/MedOnc 09/18/2016  Navigator Encounter Type Introductory phone call  Abnormal Finding Date 12/23/2007  Confirmed Diagnosis Date 12/23/2007  Surgery Date 12/23/2007  Spoke with patient and provided new patient appointment for 10/08/16 at 2:15/2:30 with Dr. Burr Medico. Informed of location of [place], valet service, and registration process. Reminded to bring photo ID,  insurance cards and a current medication list, including supplements. Patient verbalizes understanding. Mailed letter to her home as well as two ROI for her to sign and return so we can obtain her GI procedures from Dr. Darcel Bayley in [place] as well as request a CD of her CT scan from 11/15/15 from [facility] in [place] prior to the visit. HIM notified.

## 2016-09-27 DIAGNOSIS — M25512 Pain in left shoulder: Secondary | ICD-10-CM | POA: Diagnosis not present

## 2016-10-01 DIAGNOSIS — Z79899 Other long term (current) drug therapy: Secondary | ICD-10-CM | POA: Diagnosis not present

## 2016-10-01 DIAGNOSIS — G894 Chronic pain syndrome: Secondary | ICD-10-CM | POA: Diagnosis not present

## 2016-10-01 DIAGNOSIS — M25519 Pain in unspecified shoulder: Secondary | ICD-10-CM | POA: Diagnosis not present

## 2016-10-01 DIAGNOSIS — M79603 Pain in arm, unspecified: Secondary | ICD-10-CM | POA: Diagnosis not present

## 2016-10-01 DIAGNOSIS — M542 Cervicalgia: Secondary | ICD-10-CM | POA: Diagnosis not present

## 2016-10-01 DIAGNOSIS — Z79891 Long term (current) use of opiate analgesic: Secondary | ICD-10-CM | POA: Diagnosis not present

## 2016-10-03 DIAGNOSIS — M25512 Pain in left shoulder: Secondary | ICD-10-CM | POA: Diagnosis not present

## 2016-10-04 ENCOUNTER — Encounter: Payer: Self-pay | Admitting: *Deleted

## 2016-10-07 NOTE — Progress Notes (Addendum)
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 female is here to continue follow up for her small intestine and liver cancer. She was last seen by an oncologist on 12/26/15 and her PCP  on 06/03/16. 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 She continues to get a CT once a year. An ultrasound of her abdomen on September 29, 2015 showed no acute findings in status post cholecystectomy. At this time, her chromogranin was normal, as well. Pt started seeing Dr. Darrol Jump in [place] in April 2017. Since this visit, she has been receiving follow ups, scans, and chromogranins every 6 months. She follows regularly with GI. She was last seen in January 2017.    Pt was originally having RUQ discomfort and diarrhea, which caused her to go see her GI doctor. For a while, they believed it was muscular, but after a while, an [place] was done  and about 4 tumors were found in the right lobe. They then discovered the primary tumor. The ileum was resected as well as the right lobe of her liver. She recovered well. She did not have any other treatment after surgery. Her physician would check her tumor markers every 6 months, but pushed them out to every year starting in 2017. Her gallbladder has been removed. She believes she's had a small bowel surgery before as well. She had a left shoulder replacement, a hip replacement, and a surgery on her cervical spine. She has been diagnosed with Chron's disease, but she does not believe she has this.   Pt was born in [place], but has lived in [place] for 29 years, but moved down to [place] [facility] [place] in 03-17-23 after her husband died. She has children in [place], [place], and [place]. She has some grandchildren in [place] and [place]. She lives by herself.   She has some chronic RUQ pain that has not gone away. It is more present after she eats certain foods. She still has diarrhea once a day, but sometimes it is formed. Denies abdominal pain. She takes medication for acid reflux. She has been having pain in her neck because of her L shoulder, as well as shoulder pain. She has been told that she should have a reverse shoulder replacement, but she does not want to go along with that. She has HTN, which she takes medication for. She has had a minor heart attack in the past. She did  not have any heart procedures done. She use to have chronic UTIs, but later found out that there was a foreign body in her pelvis from her C-section from her son. This problem is resolved. She last had a colonoscopy a few years ago. She is due for one. She gets annual mammograms. Denies bleeding or black stool.   Her mother had bladder cancer in her late 32's. All of the tumors were removed and she was cancer free. Her half brother had cancer of the throat. He is 34 and still alive. Her other half brother died at 60 of pancreatic cancer. No other family members have had cancer.  She has never smoked. Her mother smoked in the house a lot. She is not a drinker. No recreational drug use. 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   MEDICAL HISTORY:  Past Medical History:  Diagnosis Date  . Cancer ([place])   . H/O resection of liver   . H/O shoulder surgery     SURGICAL HISTORY: Past Surgical History:  Procedure Laterality Date  . SHOULDER SURGERY      SOCIAL HISTORY: Social History   Social History  . Marital status: Widowed    Spouse name: N/A  . Number of children: N/A  . Years of education: N/A   Occupational History  . Not on file.   Social History Main Topics  . Smoking status: Never Smoker  . Smokeless tobacco: Not on file  . Alcohol use No  . Drug use: Unknown  . Sexual activity: Not on file   Other Topics Concern  . Not on file   Social History Narrative  . No narrative on file    FAMILY HISTORY: History reviewed. No pertinent family history.  ALLERGIES:  is allergic to duloxetine; meperidine; and nubain [nalbuphine hcl].  MEDICATIONS:  Current Outpatient Prescriptions  Medication Sig Dispense Refill  . aspirin 81 MG tablet Take 81 mg by mouth daily.    . Cholecalciferol (HM VITAMIN D3) 2000 units CAPS Take 1 capsule by mouth daily.    Marland Kitchen LORazepam (ATIVAN) 0.5 MG tablet Take 0.5 mg by mouth at bedtime as needed.    . metoprolol (LOPRESSOR) 50 MG tablet Take 50 mg by mouth 2 (two) times daily.    . valsartan (DIOVAN) 80 MG tablet Take 80 mg by mouth daily.  4  . alendronate (FOSAMAX) 70 MG tablet Take 70 mg by mouth once a week.  1  . DAILY MULTIPLE VITAMINS tablet Take 1 tablet by mouth daily.    . RABEprazole (ACIPHEX) 20 MG tablet Take 20 mg by mouth daily as needed.  1  . traMADol (ULTRAM) 50 MG tablet Take 50 mg by mouth 4 (four) times daily as needed.  0  . triamcinolone cream (KENALOG) 0.1 % Apply 1 application topically 2 (two) times daily. (Patient not taking: Reported on 10/08/2016) 30 g 0   No current  facility-administered medications for this visit.     REVIEW OF SYSTEMS:   Constitutional: Denies fevers, chills or abnormal night sweats (+) neck and L shoulder pain Eyes: Denies blurriness of vision, double vision or watery eyes Ears, nose, mouth, throat, and face: Denies mucositis or sore throat Respiratory: Denies cough, dyspnea or wheezes Cardiovascular: Denies palpitation, chest discomfort or lower extremity swelling Gastrointestinal:  Denies nausea, heartburn or change in bowel habits (+) chronic RUQ pain, diarrhea, acid reflux Skin: Denies abnormal skin rashes Lymphatics: Denies new lymphadenopathy or easy bruising Neurological:Denies numbness, tingling or new  weaknesses Behavioral/Psych: Mood is stable, no new changes  All other systems were reviewed with the patient and are negative.  PHYSICAL EXAMINATION: ECOG PERFORMANCE STATUS: 1 - Symptomatic but completely ambulatory  Vitals:   10/08/16 1501  BP: (!) 143/83  Pulse: 76  Resp: 17  Temp: 98.3 F (36.8 C)   Filed Weights   10/08/16 1501  Weight: 169 lb 3.2 oz (76.7 kg)   GENERAL:alert, no distress and comfortable SKIN: skin color, texture, turgor are normal, no rashes or significant lesions EYES: normal, conjunctiva are pink and non-injected, sclera clear OROPHARYNX:no exudate, no erythema and lips, buccal mucosa, and tongue normal  NECK: supple, thyroid normal size, non-tender, without nodularity LYMPH:  no palpable lymphadenopathy in the cervical, axillary or inguinal LUNGS: clear to auscultation and percussion with normal breathing effort HEART: regular rate & rhythm and no murmurs and no lower extremity edema ABDOMEN:abdomen soft, non-tender and normal bowel sounds, incision site well healed (+) tenderness upon palpation Musculoskeletal:no cyanosis of digits and no clubbing  PSYCH: alert & oriented x 3 with fluent speech NEURO: no focal motor/sensory deficits  LABORATORY DATA:  I have reviewed the data as  listed No flowsheet data found.  No flowsheet data found.   RADIOGRAPHIC STUDIES: I have personally reviewed the radiological images as listed and agreed with the findings in the report. No results found.   US Abdomen Complete 09/29/2015 1. No acute findings.  2. Status post cholecystectomy.   ASSESSMENT & PLAN:  76 year old Caucasian female  1. 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 -She has been doing well, her routine follow-up including surveillance CT scan and lab test has not showed any evidence of recurrence. -We discussed that her risk of recurrence is very small now, since she is 8 years out of her initial surgery. I recommend her to continue surveillance, with routine lab and physical exam. I recommend her for total of 10 years surveillance. -I will order baseline (CT Ab pelvis w/ contrast) scans to be done in the next month. If it is normal, I do not plan to repeat surveillance CT scan.   2. Essential Hypertension -controlled with Lopressor.   Plan -I will order labs (CBC, CMP, chromogranin) and baseline (CT Ab pelvis w/ contrast) scans to be completed within 1 month. Will call her with results.   -She will return for a follow up with labs in 1 year.  No orders of the defined types were placed in this encounter.   All questions were answered. The patient knows to call the clinic with any problems, questions or concerns. I spent 45 minutes counseling the patient face to face. The total time spent in the appointment was 60 minutes and more than 50% was on counseling.  This document serves as a record of services personally performed by Truitt Merle, MD. It was created on her behalf by Martinique Casey, a trained medical scribe. The creation of this record is based on the scribe's personal observations and the provider's statements to them. This document has  been checked and approved by the attending provider.  Truitt Merle MD   10/08/2016 3:44 PM

## 2016-10-08 ENCOUNTER — Encounter: Payer: Self-pay | Admitting: Hematology

## 2016-10-08 ENCOUNTER — Ambulatory Visit (HOSPITAL_BASED_OUTPATIENT_CLINIC_OR_DEPARTMENT_OTHER): Payer: Medicare Other | Admitting: Hematology

## 2016-10-08 ENCOUNTER — Telehealth: Payer: Self-pay | Admitting: Hematology

## 2016-10-08 VITALS — BP 143/83 | HR 76 | Temp 98.3°F | Resp 17 | Ht 64.0 in | Wt 169.2 lb

## 2016-10-08 DIAGNOSIS — I10 Essential (primary) hypertension: Secondary | ICD-10-CM | POA: Diagnosis not present

## 2016-10-08 DIAGNOSIS — Z85060 Personal history of malignant carcinoid tumor of small intestine: Secondary | ICD-10-CM

## 2016-10-08 DIAGNOSIS — C7A019 Malignant carcinoid tumor of the small intestine, unspecified portion: Secondary | ICD-10-CM

## 2016-10-08 NOTE — Telephone Encounter (Signed)
Appointments scheduled per 10/08/16 los. 2 bottles of contrast was given to the patient, along with a copy of the instructions,  per CT scheduled. Patient was given a copy of the appointment schedule and AVS report , per 10/08/16 los.

## 2016-10-11 ENCOUNTER — Other Ambulatory Visit (HOSPITAL_BASED_OUTPATIENT_CLINIC_OR_DEPARTMENT_OTHER): Payer: Medicare Other

## 2016-10-11 DIAGNOSIS — C7A019 Malignant carcinoid tumor of the small intestine, unspecified portion: Secondary | ICD-10-CM | POA: Diagnosis not present

## 2016-10-11 DIAGNOSIS — Z85060 Personal history of malignant carcinoid tumor of small intestine: Secondary | ICD-10-CM | POA: Diagnosis present

## 2016-10-11 LAB — CBC WITH DIFFERENTIAL/PLATELET
BASO%: 0.2 % (ref 0.0–2.0)
Basophils Absolute: 0 10*3/uL (ref 0.0–0.1)
EOS%: 2.2 % (ref 0.0–7.0)
Eosinophils Absolute: 0.2 10*3/uL (ref 0.0–0.5)
HEMATOCRIT: 40.8 % (ref 34.8–46.6)
HEMOGLOBIN: 13.4 g/dL (ref 11.6–15.9)
LYMPH#: 2 10*3/uL (ref 0.9–3.3)
LYMPH%: 24.6 % (ref 14.0–49.7)
MCH: 29.9 pg (ref 25.1–34.0)
MCHC: 32.8 g/dL (ref 31.5–36.0)
MCV: 91.1 fL (ref 79.5–101.0)
MONO#: 0.7 10*3/uL (ref 0.1–0.9)
MONO%: 9.1 % (ref 0.0–14.0)
NEUT%: 63.9 % (ref 38.4–76.8)
NEUTROS ABS: 5.1 10*3/uL (ref 1.5–6.5)
Platelets: 199 10*3/uL (ref 145–400)
RBC: 4.48 10*6/uL (ref 3.70–5.45)
RDW: 13.9 % (ref 11.2–14.5)
WBC: 8 10*3/uL (ref 3.9–10.3)

## 2016-10-11 LAB — COMPREHENSIVE METABOLIC PANEL
ALBUMIN: 3.9 g/dL (ref 3.5–5.0)
ALK PHOS: 96 U/L (ref 40–150)
ALT: 24 U/L (ref 0–55)
AST: 25 U/L (ref 5–34)
Anion Gap: 8 mEq/L (ref 3–11)
BUN: 16.4 mg/dL (ref 7.0–26.0)
CALCIUM: 9.7 mg/dL (ref 8.4–10.4)
CO2: 28 mEq/L (ref 22–29)
CREATININE: 0.8 mg/dL (ref 0.6–1.1)
Chloride: 103 mEq/L (ref 98–109)
EGFR: 77 mL/min/{1.73_m2} — ABNORMAL LOW (ref >90)
GLUCOSE: 84 mg/dL (ref 70–140)
POTASSIUM: 4.7 meq/L (ref 3.5–5.1)
SODIUM: 139 meq/L (ref 136–145)
TOTAL PROTEIN: 7.1 g/dL (ref 6.4–8.3)
Total Bilirubin: 0.46 mg/dL (ref 0.20–1.20)

## 2016-10-14 LAB — CHROMOGRANIN A: CHROMOGRAN A: 2 nmol/L (ref 0–5)

## 2016-10-15 DIAGNOSIS — H353132 Nonexudative age-related macular degeneration, bilateral, intermediate dry stage: Secondary | ICD-10-CM | POA: Diagnosis not present

## 2016-10-15 DIAGNOSIS — N390 Urinary tract infection, site not specified: Secondary | ICD-10-CM | POA: Diagnosis not present

## 2016-10-18 ENCOUNTER — Telehealth: Payer: Self-pay | Admitting: *Deleted

## 2016-10-18 NOTE — Telephone Encounter (Signed)
Called and spoke with patient.  Let her know that labs - CBC, CMP and chromagranin A are all normal.  She very much appreciated the call.  I also reminded her of her CT scan on Tuesday 10/22/16.

## 2016-10-18 NOTE — Telephone Encounter (Signed)
-----   Message from Truitt Merle, MD sent at 10/15/2016 10:42 PM EST ----- Please call pt and let her know the lab results of CBC, CMP and chromogranin A, which are all normal. Remind her CT scan next week.   Thanks  Truitt Merle  10/15/2016

## 2016-10-22 ENCOUNTER — Encounter (HOSPITAL_COMMUNITY): Payer: Self-pay

## 2016-10-22 ENCOUNTER — Ambulatory Visit (HOSPITAL_COMMUNITY)
Admission: RE | Admit: 2016-10-22 | Discharge: 2016-10-22 | Disposition: A | Discharge status: Home / Self Care | Payer: Medicare Other | Source: Ambulatory Visit | Attending: Hematology | Admitting: Hematology

## 2016-10-22 DIAGNOSIS — Z9889 Other specified postprocedural states: Secondary | ICD-10-CM | POA: Diagnosis not present

## 2016-10-22 DIAGNOSIS — I700 Atherosclerosis of aorta: Secondary | ICD-10-CM | POA: Diagnosis not present

## 2016-10-22 DIAGNOSIS — C7A019 Malignant carcinoid tumor of the small intestine, unspecified portion: Secondary | ICD-10-CM | POA: Diagnosis not present

## 2016-10-22 MED ORDER — IOPAMIDOL (ISOVUE-300) INJECTION 61%
100.0000 mL | Freq: Once | INTRAVENOUS | Status: AC | PRN
  Administered 2016-10-22: 100 mL via INTRAVENOUS

## 2016-10-22 MED ORDER — IOPAMIDOL (ISOVUE-300) INJECTION 61%
INTRAVENOUS | Status: AC
  Filled 2016-10-22: qty 100

## 2016-10-22 MED ORDER — SODIUM CHLORIDE 0.9 % IJ SOLN
INTRAMUSCULAR | Status: AC
Start: 2016-10-22 — End: 2016-10-22
  Filled 2016-10-22: qty 50

## 2016-10-29 DIAGNOSIS — G894 Chronic pain syndrome: Secondary | ICD-10-CM | POA: Diagnosis not present

## 2016-10-29 DIAGNOSIS — M542 Cervicalgia: Secondary | ICD-10-CM | POA: Diagnosis not present

## 2016-10-29 DIAGNOSIS — Z79891 Long term (current) use of opiate analgesic: Secondary | ICD-10-CM | POA: Diagnosis not present

## 2016-10-29 DIAGNOSIS — M25559 Pain in unspecified hip: Secondary | ICD-10-CM | POA: Diagnosis not present

## 2016-10-29 DIAGNOSIS — M25519 Pain in unspecified shoulder: Secondary | ICD-10-CM | POA: Diagnosis not present

## 2016-10-29 DIAGNOSIS — Z79899 Other long term (current) drug therapy: Secondary | ICD-10-CM | POA: Diagnosis not present

## 2016-11-08 DIAGNOSIS — B029 Zoster without complications: Secondary | ICD-10-CM | POA: Diagnosis not present

## 2016-11-08 DIAGNOSIS — L719 Rosacea, unspecified: Secondary | ICD-10-CM | POA: Diagnosis not present

## 2016-11-12 ENCOUNTER — Telehealth: Payer: Self-pay | Admitting: *Deleted

## 2016-11-12 NOTE — Telephone Encounter (Signed)
Telephone call to patient- advised CT results.

## 2016-11-12 NOTE — Telephone Encounter (Signed)
-----   Message from Truitt Merle, MD sent at 11/11/2016 12:49 PM EST ----- PLease call pt and let her know the CT scan result, which showed NED or other concerns.  Truitt Merle  11/11/2016

## 2016-11-26 DIAGNOSIS — Z79899 Other long term (current) drug therapy: Secondary | ICD-10-CM | POA: Diagnosis not present

## 2016-11-26 DIAGNOSIS — M25519 Pain in unspecified shoulder: Secondary | ICD-10-CM | POA: Diagnosis not present

## 2016-11-26 DIAGNOSIS — M542 Cervicalgia: Secondary | ICD-10-CM | POA: Diagnosis not present

## 2016-11-26 DIAGNOSIS — G894 Chronic pain syndrome: Secondary | ICD-10-CM | POA: Diagnosis not present

## 2016-11-26 DIAGNOSIS — Z79891 Long term (current) use of opiate analgesic: Secondary | ICD-10-CM | POA: Diagnosis not present

## 2016-11-26 DIAGNOSIS — M79603 Pain in arm, unspecified: Secondary | ICD-10-CM | POA: Diagnosis not present

## 2016-11-27 ENCOUNTER — Ambulatory Visit
Admission: RE | Admit: 2016-11-27 | Discharge: 2016-11-27 | Disposition: A | Discharge status: Home / Self Care | Payer: Medicare Other | Source: Ambulatory Visit | Attending: Anesthesiology | Admitting: Anesthesiology

## 2016-11-27 ENCOUNTER — Other Ambulatory Visit: Payer: Self-pay | Admitting: Anesthesiology

## 2016-11-27 DIAGNOSIS — M545 Low back pain, unspecified: Secondary | ICD-10-CM

## 2016-11-27 DIAGNOSIS — M25551 Pain in right hip: Secondary | ICD-10-CM

## 2016-11-29 DIAGNOSIS — G8929 Other chronic pain: Secondary | ICD-10-CM | POA: Diagnosis not present

## 2016-11-29 DIAGNOSIS — N760 Acute vaginitis: Secondary | ICD-10-CM | POA: Diagnosis not present

## 2016-11-29 DIAGNOSIS — R1011 Right upper quadrant pain: Secondary | ICD-10-CM | POA: Diagnosis not present

## 2016-11-29 DIAGNOSIS — N3941 Urge incontinence: Secondary | ICD-10-CM | POA: Diagnosis not present

## 2016-11-29 DIAGNOSIS — B354 Tinea corporis: Secondary | ICD-10-CM | POA: Diagnosis not present

## 2016-12-24 DIAGNOSIS — Z79899 Other long term (current) drug therapy: Secondary | ICD-10-CM | POA: Diagnosis not present

## 2016-12-24 DIAGNOSIS — M25519 Pain in unspecified shoulder: Secondary | ICD-10-CM | POA: Diagnosis not present

## 2016-12-24 DIAGNOSIS — Z79891 Long term (current) use of opiate analgesic: Secondary | ICD-10-CM | POA: Diagnosis not present

## 2016-12-24 DIAGNOSIS — G894 Chronic pain syndrome: Secondary | ICD-10-CM | POA: Diagnosis not present

## 2016-12-24 DIAGNOSIS — M542 Cervicalgia: Secondary | ICD-10-CM | POA: Diagnosis not present

## 2016-12-24 DIAGNOSIS — M25559 Pain in unspecified hip: Secondary | ICD-10-CM | POA: Diagnosis not present

## 2017-01-21 DIAGNOSIS — M25559 Pain in unspecified hip: Secondary | ICD-10-CM | POA: Diagnosis not present

## 2017-01-21 DIAGNOSIS — Z79899 Other long term (current) drug therapy: Secondary | ICD-10-CM | POA: Diagnosis not present

## 2017-01-21 DIAGNOSIS — M47812 Spondylosis without myelopathy or radiculopathy, cervical region: Secondary | ICD-10-CM | POA: Diagnosis not present

## 2017-01-21 DIAGNOSIS — Z79891 Long term (current) use of opiate analgesic: Secondary | ICD-10-CM | POA: Diagnosis not present

## 2017-01-21 DIAGNOSIS — G894 Chronic pain syndrome: Secondary | ICD-10-CM | POA: Diagnosis not present

## 2017-01-21 DIAGNOSIS — M25519 Pain in unspecified shoulder: Secondary | ICD-10-CM | POA: Diagnosis not present

## 2017-01-22 ENCOUNTER — Other Ambulatory Visit: Payer: Self-pay | Admitting: Orthopedic Surgery

## 2017-01-22 DIAGNOSIS — M25512 Pain in left shoulder: Secondary | ICD-10-CM

## 2017-01-23 ENCOUNTER — Ambulatory Visit
Admission: RE | Admit: 2017-01-23 | Discharge: 2017-01-23 | Disposition: A | Discharge status: Home / Self Care | Payer: Medicare Other | Source: Ambulatory Visit | Attending: Orthopedic Surgery | Admitting: Orthopedic Surgery

## 2017-01-23 DIAGNOSIS — M25512 Pain in left shoulder: Secondary | ICD-10-CM | POA: Diagnosis not present

## 2017-01-28 DIAGNOSIS — I10 Essential (primary) hypertension: Secondary | ICD-10-CM | POA: Diagnosis not present

## 2017-01-28 DIAGNOSIS — I491 Atrial premature depolarization: Secondary | ICD-10-CM | POA: Diagnosis not present

## 2017-01-28 DIAGNOSIS — M810 Age-related osteoporosis without current pathological fracture: Secondary | ICD-10-CM | POA: Diagnosis not present

## 2017-01-28 DIAGNOSIS — M25512 Pain in left shoulder: Secondary | ICD-10-CM | POA: Diagnosis not present

## 2017-01-29 DIAGNOSIS — M542 Cervicalgia: Secondary | ICD-10-CM | POA: Diagnosis not present

## 2017-01-29 DIAGNOSIS — M47812 Spondylosis without myelopathy or radiculopathy, cervical region: Secondary | ICD-10-CM | POA: Diagnosis not present

## 2017-01-29 DIAGNOSIS — G894 Chronic pain syndrome: Secondary | ICD-10-CM | POA: Diagnosis not present

## 2017-01-30 DIAGNOSIS — Z79891 Long term (current) use of opiate analgesic: Secondary | ICD-10-CM | POA: Diagnosis not present

## 2017-01-30 DIAGNOSIS — Z5181 Encounter for therapeutic drug level monitoring: Secondary | ICD-10-CM | POA: Diagnosis not present

## 2017-01-30 DIAGNOSIS — Z79899 Other long term (current) drug therapy: Secondary | ICD-10-CM | POA: Diagnosis not present

## 2017-02-04 DIAGNOSIS — M542 Cervicalgia: Secondary | ICD-10-CM | POA: Diagnosis not present

## 2017-02-04 DIAGNOSIS — M961 Postlaminectomy syndrome, not elsewhere classified: Secondary | ICD-10-CM | POA: Diagnosis not present

## 2017-02-05 DIAGNOSIS — M19212 Secondary osteoarthritis, left shoulder: Secondary | ICD-10-CM | POA: Diagnosis not present

## 2017-03-10 DIAGNOSIS — R0789 Other chest pain: Secondary | ICD-10-CM | POA: Diagnosis not present

## 2017-03-10 DIAGNOSIS — I10 Essential (primary) hypertension: Secondary | ICD-10-CM | POA: Diagnosis not present

## 2017-03-17 DIAGNOSIS — I10 Essential (primary) hypertension: Secondary | ICD-10-CM | POA: Diagnosis not present

## 2017-03-17 DIAGNOSIS — R0789 Other chest pain: Secondary | ICD-10-CM | POA: Diagnosis not present

## 2017-03-31 DIAGNOSIS — R0789 Other chest pain: Secondary | ICD-10-CM | POA: Diagnosis not present

## 2017-03-31 DIAGNOSIS — I10 Essential (primary) hypertension: Secondary | ICD-10-CM | POA: Diagnosis not present

## 2017-04-03 DIAGNOSIS — F411 Generalized anxiety disorder: Secondary | ICD-10-CM | POA: Diagnosis not present

## 2017-04-03 DIAGNOSIS — F341 Dysthymic disorder: Secondary | ICD-10-CM | POA: Diagnosis not present

## 2017-04-03 DIAGNOSIS — G8929 Other chronic pain: Secondary | ICD-10-CM | POA: Diagnosis not present

## 2017-04-03 DIAGNOSIS — M961 Postlaminectomy syndrome, not elsewhere classified: Secondary | ICD-10-CM | POA: Diagnosis not present

## 2017-04-03 DIAGNOSIS — M545 Low back pain: Secondary | ICD-10-CM | POA: Diagnosis not present

## 2017-04-03 DIAGNOSIS — Z79899 Other long term (current) drug therapy: Secondary | ICD-10-CM | POA: Diagnosis not present

## 2017-04-03 DIAGNOSIS — M47812 Spondylosis without myelopathy or radiculopathy, cervical region: Secondary | ICD-10-CM | POA: Diagnosis not present

## 2017-04-07 ENCOUNTER — Telehealth: Payer: Self-pay | Admitting: *Deleted

## 2017-04-07 NOTE — Telephone Encounter (Signed)
Pt called requesting an appt with Dr. Burr Medico .   Stated her [place] had notified pt that Valsartan will be off the shelf due to risk of causing cancer in pts.   Pt stated she was concerned that her cancer might be caused by Valsartan.  Stated she had been taking this med for a long time.    Pt was suggested by her cardiologist to contact Dr. Burr Medico for further evaluation.  Pt would like to be seen sooner than scheduled appt in 2019. Pt's    Phone      [phone]    ;    Cell       [phone].

## 2017-04-11 DIAGNOSIS — H25013 Cortical age-related cataract, bilateral: Secondary | ICD-10-CM | POA: Diagnosis not present

## 2017-04-11 DIAGNOSIS — H43813 Vitreous degeneration, bilateral: Secondary | ICD-10-CM | POA: Diagnosis not present

## 2017-04-11 DIAGNOSIS — H353132 Nonexudative age-related macular degeneration, bilateral, intermediate dry stage: Secondary | ICD-10-CM | POA: Diagnosis not present

## 2017-04-11 DIAGNOSIS — H2513 Age-related nuclear cataract, bilateral: Secondary | ICD-10-CM | POA: Diagnosis not present

## 2017-04-19 DIAGNOSIS — N390 Urinary tract infection, site not specified: Secondary | ICD-10-CM | POA: Diagnosis not present

## 2017-04-22 DIAGNOSIS — N390 Urinary tract infection, site not specified: Secondary | ICD-10-CM | POA: Diagnosis not present

## 2017-06-13 DIAGNOSIS — M67479 Ganglion, unspecified ankle and foot: Secondary | ICD-10-CM | POA: Diagnosis not present

## 2017-06-13 DIAGNOSIS — M25512 Pain in left shoulder: Secondary | ICD-10-CM | POA: Diagnosis not present

## 2017-06-13 DIAGNOSIS — I10 Essential (primary) hypertension: Secondary | ICD-10-CM | POA: Diagnosis not present

## 2017-06-13 DIAGNOSIS — I491 Atrial premature depolarization: Secondary | ICD-10-CM | POA: Diagnosis not present

## 2017-06-13 DIAGNOSIS — M810 Age-related osteoporosis without current pathological fracture: Secondary | ICD-10-CM | POA: Diagnosis not present

## 2017-06-16 ENCOUNTER — Ambulatory Visit (INDEPENDENT_AMBULATORY_CARE_PROVIDER_SITE_OTHER): Payer: Medicare Other | Admitting: Podiatry

## 2017-06-16 ENCOUNTER — Encounter: Payer: Self-pay | Admitting: Podiatry

## 2017-06-16 ENCOUNTER — Ambulatory Visit (INDEPENDENT_AMBULATORY_CARE_PROVIDER_SITE_OTHER): Payer: Medicare Other

## 2017-06-16 DIAGNOSIS — M2012 Hallux valgus (acquired), left foot: Secondary | ICD-10-CM

## 2017-06-16 DIAGNOSIS — M6740 Ganglion, unspecified site: Secondary | ICD-10-CM

## 2017-06-16 NOTE — Patient Instructions (Signed)
Ganglion Cyst A ganglion cyst is a noncancerous, fluid-filled lump that occurs near joints or tendons. The ganglion cyst grows out of a joint or the lining of a tendon. It most often develops in the hand or wrist, but it can also develop in the shoulder, elbow, hip, knee, ankle, or foot. The round or oval ganglion cyst can be the size of a pea or larger than a grape. Increased activity may enlarge the size of the cyst because more fluid starts to build up. What are the causes? It is not known what causes a ganglion cyst to grow. However, it may be related to:  Inflammation or irritation around the joint.  An injury.  Repetitive movements or overuse.  Arthritis.  What increases the risk? Risk factors include:  Being a woman.  Being age 20-50.  What are the signs or symptoms? Symptoms may include:  A lump. This most often appears on the hand or wrist, but it can occur in other areas of the body.  Tingling.  Pain.  Numbness.  Muscle weakness.  Weak grip.  Less movement in a joint.  How is this diagnosed? Ganglion cysts are most often diagnosed based on a physical exam. Your health care provider will feel the lump and may shine a light alongside it. If it is a ganglion cyst, a light often shines through it. Your health care provider may order an X-ray, ultrasound, or MRI to rule out other conditions. How is this treated? Ganglion cysts usually go away on their own without treatment. If pain or other symptoms are involved, treatment may be needed. Treatment is also needed if the ganglion cyst limits your movement or if it gets infected. Treatment may include:  Wearing a brace or splint on your wrist or finger.  Taking anti-inflammatory medicine.  Draining fluid from the lump with a needle (aspiration).  Injecting a steroid into the joint.  Surgery to remove the ganglion cyst.  Follow these instructions at home:  Do not press on the ganglion cyst, poke it with a  needle, or hit it.  Take medicines only as directed by your health care provider.  Wear your brace or splint as directed by your health care provider.  Watch your ganglion cyst for any changes.  Keep all follow-up visits as directed by your health care provider. This is important. Contact a health care provider if:  Your ganglion cyst becomes larger or more painful.  You have increased redness, red streaks, or swelling.  You have pus coming from the lump.  You have weakness or numbness in the affected area.  You have a fever or chills. This information is not intended to replace advice given to you by your health care provider. Make sure you discuss any questions you have with your health care provider. Document Released: 09/06/2000 Document Revised: 02/15/2016 Document Reviewed: 02/22/2014 Elsevier Interactive Patient Education  2018 [employer]  

## 2017-06-18 NOTE — Progress Notes (Signed)
Subjective:    Patient ID: Emma Buchanan, female   DOB: 76 y.o.   MRN: [id number]   HPI 76 year old female presents the office today for concerns of assistive the back of the left heel which is been ongoing for greater than 2 years. She states that is not gotten worse and she has done some research and she thinks this is a ganglion cyst. She states that she previously dropped ajar jelly on the top of her left foot over a year ago at that point she noticed that her second toe started to drift over her big toe. She's had a bunion for a while. She has no pain to the toes and she can wear regular shoes without any difficulty. She is not concerned about the digital deformity. She has no other concerns today.   Review of Systems  All other systems reviewed and are negative.  Past Medical History:  Diagnosis Date  . Cancer ([place])   . H/O resection of liver   . H/O shoulder surgery   . Hypertension   . Myocardial infarction [facility])    Past Surgical History:  Procedure Laterality Date  . CERVICAL SPINE SURGERY    . CHOLECYSTECTOMY    . LIVER RESECTION    . SHOULDER SURGERY    . SMALL INTESTINE SURGERY       Current Outpatient Prescriptions:  .  alendronate (FOSAMAX) 70 MG tablet, Take 70 mg by mouth once a week., Disp: , Rfl: 1 .  aspirin 81 MG tablet, Take 81 mg by mouth daily., Disp: , Rfl:  .  Cholecalciferol (HM VITAMIN D3) 2000 units CAPS, Take 1 capsule by mouth daily., Disp: , Rfl:  .  DAILY MULTIPLE VITAMINS tablet, Take 1 tablet by mouth daily., Disp: , Rfl:  .  LORazepam (ATIVAN) 0.5 MG tablet, Take 0.5 mg by mouth at bedtime as needed., Disp: , Rfl:  .  metoprolol (LOPRESSOR) 50 MG tablet, Take 50 mg by mouth 2 (two) times daily., Disp: , Rfl:  .  RABEprazole (ACIPHEX) 20 MG tablet, Take 20 mg by mouth daily as needed., Disp: , Rfl: 1 .  traMADol (ULTRAM) 50 MG tablet, Take 50 mg by mouth 4 (four) times daily as needed., Disp: , Rfl: 0 .  triamcinolone cream (KENALOG) 0.1  %, Apply 1 application topically 2 (two) times daily. (Patient not taking: Reported on 10/08/2016), Disp: 30 g, Rfl: 0 .  valsartan (DIOVAN) 80 MG tablet, Take 80 mg by mouth daily., Disp: , Rfl: 4 Allergies  Allergen Reactions  . Duloxetine Nausea And Vomiting    Cause severe pain  . Meperidine Nausea And Vomiting  . Nubain [Nalbuphine Hcl]    Social History   Social History  . Marital status: Widowed    Spouse name: N/A  . Number of children: N/A  . Years of education: N/A   Occupational History  . Not on file.   Social History Main Topics  . Smoking status: Never Smoker  . Smokeless tobacco: Never Used  . Alcohol use No  . Drug use: No  . Sexual activity: Not on file   Other Topics Concern  . Not on file   Social History Narrative  . No narrative on file        Objective:  Physical Exam General: AAO x3, NAD  Dermatological: Skin is warm, dry and supple bilateral. There are no open sores, no preulcerative lesions, no rash or signs of infection present.  Vascular: Dorsalis Pedis artery and  Posterior Tibial artery pedal pulses are 2/4 bilateral with immedate capillary fill time.  There is no pain with calf compression, swelling, warmth, erythema.   Neruologic: Grossly intact via light touch bilateral. Protective threshold with Semmes Wienstein monofilament intact to all pedal sites bilateral.   Musculoskeletal: To the posterior aspect of the left heel is adjacent to the Achilles tendon is a mobile soft fluid-filled soft tissue mass consistent with a ganglion cyst. There is no tenderness to palpation of the area and subjective the area has been the same size is not getting bigger. There is no overlying erythema or skin change. 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 There is no pain to the digits. There is no area pinpoint bony tenderness or pain the vibratory sensation. Muscular strength 5/5 in all groups tested bilateral.  Gait: Unassisted,  Nonantalgic.      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 No active foreign body identified. There is no evidence of acute fracture. 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 -Regards the cyst I discussed aspiration to get a more definitive diagnosis but she equinus today he also declined an MRI. She just would have area checked. Discussed that the area becomes symptomatic or if its getting larger than we need to biopsy this but she wishes to hold off on this today. I dispensed an offloading pad to help take pressure off the area. -Regards the digital deformity she is having no pain to the area. Discussed changing shoes to help prevent symptoms.  Celesta Gentile, DPM

## 2017-06-26 DIAGNOSIS — R350 Frequency of micturition: Secondary | ICD-10-CM | POA: Diagnosis not present

## 2017-07-03 DIAGNOSIS — F411 Generalized anxiety disorder: Secondary | ICD-10-CM | POA: Diagnosis not present

## 2017-07-03 DIAGNOSIS — M961 Postlaminectomy syndrome, not elsewhere classified: Secondary | ICD-10-CM | POA: Diagnosis not present

## 2017-07-03 DIAGNOSIS — M47812 Spondylosis without myelopathy or radiculopathy, cervical region: Secondary | ICD-10-CM | POA: Diagnosis not present

## 2017-07-03 DIAGNOSIS — F341 Dysthymic disorder: Secondary | ICD-10-CM | POA: Diagnosis not present

## 2017-07-03 DIAGNOSIS — G8929 Other chronic pain: Secondary | ICD-10-CM | POA: Diagnosis not present

## 2017-07-03 DIAGNOSIS — M542 Cervicalgia: Secondary | ICD-10-CM | POA: Diagnosis not present

## 2017-07-03 DIAGNOSIS — Z79899 Other long term (current) drug therapy: Secondary | ICD-10-CM | POA: Diagnosis not present

## 2017-07-25 DIAGNOSIS — R413 Other amnesia: Secondary | ICD-10-CM | POA: Diagnosis not present

## 2017-07-25 DIAGNOSIS — M2550 Pain in unspecified joint: Secondary | ICD-10-CM | POA: Diagnosis not present

## 2017-07-25 DIAGNOSIS — Z85060 Personal history of malignant carcinoid tumor of small intestine: Secondary | ICD-10-CM | POA: Diagnosis not present

## 2017-07-25 DIAGNOSIS — Z23 Encounter for immunization: Secondary | ICD-10-CM | POA: Diagnosis not present

## 2017-07-25 DIAGNOSIS — M150 Primary generalized (osteo)arthritis: Secondary | ICD-10-CM | POA: Diagnosis not present

## 2017-07-25 DIAGNOSIS — M6740 Ganglion, unspecified site: Secondary | ICD-10-CM | POA: Diagnosis not present

## 2017-07-25 DIAGNOSIS — G894 Chronic pain syndrome: Secondary | ICD-10-CM | POA: Diagnosis not present

## 2017-07-25 DIAGNOSIS — N3941 Urge incontinence: Secondary | ICD-10-CM | POA: Diagnosis not present

## 2017-07-25 DIAGNOSIS — I10 Essential (primary) hypertension: Secondary | ICD-10-CM | POA: Diagnosis not present

## 2017-08-11 DIAGNOSIS — R1011 Right upper quadrant pain: Secondary | ICD-10-CM | POA: Diagnosis not present

## 2017-08-11 DIAGNOSIS — R51 Headache: Secondary | ICD-10-CM | POA: Diagnosis not present

## 2017-08-12 ENCOUNTER — Other Ambulatory Visit: Payer: Self-pay | Admitting: Physician Assistant

## 2017-08-12 DIAGNOSIS — R51 Headache: Principal | ICD-10-CM

## 2017-08-12 DIAGNOSIS — R519 Headache, unspecified: Secondary | ICD-10-CM

## 2017-08-13 ENCOUNTER — Other Ambulatory Visit: Payer: Self-pay | Admitting: Physician Assistant

## 2017-08-13 DIAGNOSIS — R519 Headache, unspecified: Secondary | ICD-10-CM

## 2017-08-13 DIAGNOSIS — R51 Headache: Principal | ICD-10-CM

## 2017-08-18 ENCOUNTER — Other Ambulatory Visit: Payer: Self-pay | Admitting: Physician Assistant

## 2017-08-18 DIAGNOSIS — R1011 Right upper quadrant pain: Secondary | ICD-10-CM

## 2017-08-20 ENCOUNTER — Ambulatory Visit
Admission: RE | Admit: 2017-08-20 | Discharge: 2017-08-20 | Disposition: A | Discharge status: Home / Self Care | Payer: Medicare Other | Source: Ambulatory Visit | Attending: Physician Assistant | Admitting: Physician Assistant

## 2017-08-20 DIAGNOSIS — R51 Headache: Secondary | ICD-10-CM | POA: Diagnosis not present

## 2017-08-20 DIAGNOSIS — R1011 Right upper quadrant pain: Secondary | ICD-10-CM

## 2017-08-20 DIAGNOSIS — R519 Headache, unspecified: Secondary | ICD-10-CM

## 2017-08-25 DIAGNOSIS — Z85060 Personal history of malignant carcinoid tumor of small intestine: Secondary | ICD-10-CM | POA: Diagnosis not present

## 2017-08-25 DIAGNOSIS — Z0000 Encounter for general adult medical examination without abnormal findings: Secondary | ICD-10-CM | POA: Diagnosis not present

## 2017-08-25 DIAGNOSIS — R1011 Right upper quadrant pain: Secondary | ICD-10-CM | POA: Diagnosis not present

## 2017-08-25 DIAGNOSIS — M25512 Pain in left shoulder: Secondary | ICD-10-CM | POA: Diagnosis not present

## 2017-08-25 DIAGNOSIS — I10 Essential (primary) hypertension: Secondary | ICD-10-CM | POA: Diagnosis not present

## 2017-08-25 DIAGNOSIS — R413 Other amnesia: Secondary | ICD-10-CM | POA: Diagnosis not present

## 2017-08-25 DIAGNOSIS — M810 Age-related osteoporosis without current pathological fracture: Secondary | ICD-10-CM | POA: Diagnosis not present

## 2017-08-25 DIAGNOSIS — Z8505 Personal history of malignant neoplasm of liver: Secondary | ICD-10-CM | POA: Diagnosis not present

## 2017-09-29 DIAGNOSIS — Z79899 Other long term (current) drug therapy: Secondary | ICD-10-CM | POA: Diagnosis not present

## 2017-09-29 DIAGNOSIS — F341 Dysthymic disorder: Secondary | ICD-10-CM | POA: Diagnosis not present

## 2017-09-29 DIAGNOSIS — M47812 Spondylosis without myelopathy or radiculopathy, cervical region: Secondary | ICD-10-CM | POA: Diagnosis not present

## 2017-09-29 DIAGNOSIS — M542 Cervicalgia: Secondary | ICD-10-CM | POA: Diagnosis not present

## 2017-09-29 DIAGNOSIS — F411 Generalized anxiety disorder: Secondary | ICD-10-CM | POA: Diagnosis not present

## 2017-09-29 DIAGNOSIS — M961 Postlaminectomy syndrome, not elsewhere classified: Secondary | ICD-10-CM | POA: Diagnosis not present

## 2017-10-01 ENCOUNTER — Inpatient Hospital Stay: Payer: Medicare Other | Attending: Hematology

## 2017-10-01 DIAGNOSIS — I10 Essential (primary) hypertension: Secondary | ICD-10-CM | POA: Insufficient documentation

## 2017-10-01 DIAGNOSIS — C787 Secondary malignant neoplasm of liver and intrahepatic bile duct: Secondary | ICD-10-CM | POA: Diagnosis not present

## 2017-10-01 DIAGNOSIS — Z9049 Acquired absence of other specified parts of digestive tract: Secondary | ICD-10-CM | POA: Insufficient documentation

## 2017-10-01 DIAGNOSIS — I700 Atherosclerosis of aorta: Secondary | ICD-10-CM | POA: Diagnosis not present

## 2017-10-01 DIAGNOSIS — Z79899 Other long term (current) drug therapy: Secondary | ICD-10-CM | POA: Insufficient documentation

## 2017-10-01 DIAGNOSIS — Z7982 Long term (current) use of aspirin: Secondary | ICD-10-CM | POA: Diagnosis not present

## 2017-10-01 DIAGNOSIS — I252 Old myocardial infarction: Secondary | ICD-10-CM | POA: Diagnosis not present

## 2017-10-01 DIAGNOSIS — C7A019 Malignant carcinoid tumor of the small intestine, unspecified portion: Secondary | ICD-10-CM | POA: Insufficient documentation

## 2017-10-01 LAB — COMPREHENSIVE METABOLIC PANEL
ALK PHOS: 85 U/L (ref 40–150)
ALT: 18 U/L (ref 0–55)
ANION GAP: 8 (ref 3–11)
AST: 23 U/L (ref 5–34)
Albumin: 3.8 g/dL (ref 3.5–5.0)
BUN: 11 mg/dL (ref 7–26)
CALCIUM: 9.4 mg/dL (ref 8.4–10.4)
CO2: 27 mmol/L (ref 22–29)
Chloride: 105 mmol/L (ref 98–109)
Creatinine, Ser: 0.82 mg/dL (ref 0.60–1.10)
GFR calc non Af Amer: >60 mL/min (ref >60)
Glucose, Bld: 124 mg/dL (ref 70–140)
Potassium: 4.2 mmol/L (ref 3.3–4.7)
SODIUM: 140 mmol/L (ref 136–145)
Total Bilirubin: 0.7 mg/dL (ref 0.2–1.2)
Total Protein: 7 g/dL (ref 6.4–8.3)

## 2017-10-01 LAB — CBC WITH DIFFERENTIAL/PLATELET
Abs Granulocyte: 4.6 10*3/uL (ref 1.5–6.5)
Basophils Absolute: 0 10*3/uL (ref 0.0–0.1)
Basophils Relative: 0 %
EOS ABS: 0.1 10*3/uL (ref 0.0–0.5)
Eosinophils Relative: 2 %
HCT: 45.2 % (ref 34.8–46.6)
HEMOGLOBIN: 14.6 g/dL (ref 11.6–15.9)
LYMPHS ABS: 1.7 10*3/uL (ref 0.9–3.3)
Lymphocytes Relative: 24 %
MCH: 29.9 pg (ref 25.1–34.0)
MCHC: 32.3 g/dL (ref 31.5–36.0)
MCV: 92.6 fL (ref 79.5–101.0)
MONOS PCT: 9 %
Monocytes Absolute: 0.6 10*3/uL (ref 0.1–0.9)
Neutro Abs: 4.6 10*3/uL (ref 1.5–6.5)
Neutrophils Relative %: 65 %
Platelets: 195 10*3/uL (ref 145–400)
RBC: 4.88 MIL/uL (ref 3.70–5.45)
RDW: 13.5 % (ref 11.2–16.1)
WBC: 7.1 10*3/uL (ref 3.9–10.3)

## 2017-10-03 LAB — CHROMOGRANIN A: Chromogranin A: 1 nmol/L (ref 0–5)

## 2017-10-08 ENCOUNTER — Encounter: Payer: Self-pay | Admitting: Nurse Practitioner

## 2017-10-08 ENCOUNTER — Telehealth: Payer: Self-pay | Admitting: Nurse Practitioner

## 2017-10-08 ENCOUNTER — Inpatient Hospital Stay (HOSPITAL_BASED_OUTPATIENT_CLINIC_OR_DEPARTMENT_OTHER): Payer: Medicare Other | Admitting: Nurse Practitioner

## 2017-10-08 VITALS — BP 135/64 | HR 77 | Temp 98.1°F | Resp 18 | Ht 64.0 in | Wt 173.5 lb

## 2017-10-08 DIAGNOSIS — C787 Secondary malignant neoplasm of liver and intrahepatic bile duct: Secondary | ICD-10-CM

## 2017-10-08 DIAGNOSIS — I10 Essential (primary) hypertension: Secondary | ICD-10-CM

## 2017-10-08 DIAGNOSIS — C7A019 Malignant carcinoid tumor of the small intestine, unspecified portion: Secondary | ICD-10-CM | POA: Diagnosis not present

## 2017-10-08 DIAGNOSIS — Z79899 Other long term (current) drug therapy: Secondary | ICD-10-CM | POA: Diagnosis not present

## 2017-10-08 DIAGNOSIS — I252 Old myocardial infarction: Secondary | ICD-10-CM | POA: Diagnosis not present

## 2017-10-08 DIAGNOSIS — I700 Atherosclerosis of aorta: Secondary | ICD-10-CM | POA: Diagnosis not present

## 2017-10-08 NOTE — Telephone Encounter (Signed)
Scheduled appt pe r1/16 los - Gave patient AVS and calender per los.  

## 2017-10-08 NOTE — Progress Notes (Signed)
[place]  Telephone:([phone] Fax:([phone]) [phone]  Clinic Follow up Note   Patient Care Team: Dianna Rossetti, NP as PCP - General (Nurse Practitioner) 10/08/2017  CHIEF COMPLAINT: Follow up malignant carcinoid tumor of small intestine   SUMMARY OF ONCOLOGIC HISTORY: Oncology History   Presented w/flushing abdominal pain     Malignant carcinoid tumor of small intestine ([place])   12/23/2007 Initial Diagnosis    Malignant carcinoid tumor of small intestine ([place])      12/23/2007 Definitive Surgery    Resection of primary tumor in small intestine and lobectomy at [facility] 2009--Stage IV      10/03/2015 Tumor Marker    Chromogranin A=2.0      11/15/2015 Imaging    CT ABDOMEN/PELVIS: Diffuse hepatic steatosis, no focal lesions; high-grade SBO likely due to adhesions; mild mesenteric edema and trace ascites ([place])      10/22/2016 Imaging    CT Abdomen pelvis W Contrast  IMPRESSION: 1. No acute findings and no specific features identified to suggest recurrent tumor or metastatic disease. 2. Status post partial right hepatectomy. 3. Aortic atherosclerosis.      08/20/2017 Imaging    CT Abdomen pelvis WO Contrast  IMPRESSION: No acute findings within the abdomen. Stable postsurgical changes of right hepatectomy and distal small bowel resection. Prominence of bilateral adnexal regions, with uncertain significance.      CURRENT THERAPY: Surveillance  INTERVAL HISTORY: Emma Buchanan returns as scheduled for annual surveillance follow up. She denies changes in her overall health. Eating and drinking well, no fatigue, fever, chills, flushing, cough, dyspnea, bleeding, abdominal pain, bloating, or distention. She has chronic lose stool that began after cancer resection. Takes Imodium PRN. Chronic left shoulder pain with limited ROM, controlled on tramadol BID. She was taken off valsartan and placed on olmesartan by her cardiologist  Dr. Woody Seller; she is concerned valsartan caused her cancer. She had a CT at her PCP in 07/2017 for RUQ pain that is now resolved.   REVIEW OF SYSTEMS:   Constitutional: Denies fatigue, fevers, chills or abnormal weight loss Eyes: Denies blurriness of vision Ears, nose, mouth, throat, and face: Denies mucositis or sore throat Respiratory: Denies cough, dyspnea or wheezes Cardiovascular: Denies palpitation, chest discomfort or lower extremity swelling Gastrointestinal:  Denies nausea, vomiting, constipation, diarrhea, heartburn, bloating, distention, abdominal pain, or change in bowel habits (+) chronic lose stool, controlled with imodium PRN  Skin: Denies abnormal skin rashes Lymphatics: Denies new lymphadenopathy or easy bruising Neurological:Denies numbness, tingling or new weaknesses. Denies flushing Behavioral/Psych: Mood is stable, no new changes  MSK: (+) chronic left shoulder pain 2/2 shoulder replacement  All other systems were reviewed with the patient and are negative.  MEDICAL HISTORY:  Past Medical History:  Diagnosis Date  . Cancer ([place])   . H/O resection of liver   . H/O shoulder surgery   . Hypertension   . Myocardial infarction [facility])     SURGICAL HISTORY: Past Surgical History:  Procedure Laterality Date  . CERVICAL SPINE SURGERY    . CHOLECYSTECTOMY    . LIVER RESECTION    . SHOULDER SURGERY    . SMALL INTESTINE SURGERY      I have reviewed the social history and family history with the patient and they are unchanged from previous note.  ALLERGIES:  is allergic to duloxetine; meperidine; and nubain [nalbuphine hcl].  MEDICATIONS:  Current Outpatient Medications  Medication Sig Dispense Refill  . alendronate (FOSAMAX) 70 MG tablet Take  70 mg by mouth once a week.  1  . aspirin 81 MG tablet Take 81 mg by mouth daily.    . Cholecalciferol (HM VITAMIN D3) 2000 units CAPS Take 1 capsule by mouth daily.    Marland Kitchen DAILY MULTIPLE VITAMINS tablet Take 1 tablet by mouth  daily.    Marland Kitchen LORazepam (ATIVAN) 0.5 MG tablet Take 0.5 mg by mouth at bedtime as needed.    . metoprolol (LOPRESSOR) 50 MG tablet Take 50 mg by mouth 2 (two) times daily.    . traMADol (ULTRAM) 50 MG tablet Take 50 mg by mouth 4 (four) times daily.   0  . triamcinolone cream (KENALOG) 0.1 % Apply 1 application topically 2 (two) times daily. 30 g 0  . RABEprazole (ACIPHEX) 20 MG tablet Take 20 mg by mouth daily as needed.  1   No current facility-administered medications for this visit.     PHYSICAL EXAMINATION: ECOG PERFORMANCE STATUS: 1 - Symptomatic but completely ambulatory  Vitals:   10/08/17 1126  BP: 135/64  Pulse: 77  Resp: 18  Temp: 98.1 F (36.7 C)  SpO2: 97%   Filed Weights   10/08/17 1126  Weight: 173 lb 8 oz (78.7 kg)    GENERAL:alert, no distress and comfortable SKIN: skin color, texture, turgor are normal, no rashes or significant lesions EYES: normal, Conjunctiva are pink and non-injected, sclera clear OROPHARYNX:no exudate, no erythema and lips, buccal mucosa, and tongue normal  NECK: supple, thyroid normal size, non-tender, without nodularity LYMPH:  no palpable cervical, supraclavicular, axillary, or inguinal lymphadenopathy  LUNGS: clear to auscultation bilaterally with normal breathing effort HEART: regular rate & rhythm and no murmurs and no lower extremity edema ABDOMEN:abdomen soft, non-tender and normal bowel sounds; surgical incision well healed  Musculoskeletal:no cyanosis of digits and no clubbing (+) limited LUE ROM NEURO: alert & oriented x 3 with fluent speech, no focal motor/sensory deficits  LABORATORY DATA:  I have reviewed the data as listed CBC Latest Ref Rng & Units 10/01/2017 10/11/2016  WBC 3.9 - 10.3 K/uL 7.1 8.0  Hemoglobin 11.6 - 15.9 g/dL 14.6 13.4  Hematocrit 34.8 - 46.6 % 45.2 40.8  Platelets 145 - 400 K/uL 195 199    CMP Latest Ref Rng & Units 10/01/2017 10/11/2016  Glucose 70 - 140 mg/dL 124 84  BUN 7 - 26 mg/dL 11 16.4    Creatinine 0.60 - 1.10 mg/dL 0.82 0.8  Sodium 136 - 145 mmol/L 140 139  Potassium 3.3 - 4.7 mmol/L 4.2 4.7  Chloride 98 - 109 mmol/L 105 -  CO2 22 - 29 mmol/L 27 28  Calcium 8.4 - 10.4 mg/dL 9.4 9.7  Total Protein 6.4 - 8.3 g/dL 7.0 7.1  Total Bilirubin 0.2 - 1.2 mg/dL 0.7 0.46  Alkaline Phos 40 - 150 U/L 85 96  AST 5 - 34 U/L 23 25  ALT 0 - 55 U/L 18 24    RADIOGRAPHIC STUDIES: I have personally reviewed the radiological images as listed and agreed with the findings in the report. No results found.   CT AP WO CONTRAST 07/2017 per other provider IMPRESSION: No acute findings within the abdomen.  Stable postsurgical changes of right hepatectomy and distal small bowel resection.  Prominence of bilateral adnexal regions, with uncertain significance. Evaluation with pelvic ultrasound or MRI may be considered to exclude presence of adnexal masses.   CT AP W CONTRAST 10/22/16  IMPRESSION: 1. No acute findings and no specific features identified to suggest recurrent tumor or metastatic  disease. 2. Status post partial right hepatectomy. 3. Aortic atherosclerosis.  US Abdomen Complete 09/29/2015 1. No acute findings.  2. Status post cholecystectomy.   ASSESSMENT & PLAN: 77 year old Caucasian female  1. 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 Essential HTN   Ms. Purdum appears stable today, she had complete surgical resection of small intestine and liver metastasis in 2009. She had CT AP in 07/2017 per outside provider for RUQ pain that showed stable post-surgical changes of right hepatectomy and distal small bowel resection. There was incidental finding of prominent bilateral adnexal regions with unknown significance, consideration of pelvic US or MRI was suggested. I feel this is unrelated to her previous metastatic carcinoid tumor. She does not have gyn, I suggest she call her PCP/ordering provider to follow up, she will do so. She is  clinically doing well, Chromogranin A, Cmet, and CBC all normal today; physical exam unremarkable; no concern for recurrence. She will return for f/u with Dr. Burr Medico in 1 year, labs 1 week prior.   For her concerns regarding anti-hypertensive and link to cancer, I suggest she discuss with her cardiologist.   PLAN -Labs reviewed, physical exam unremarkable, labs WNL, no concern for recurrence -lab, f/u with Dr. Burr Medico in 1 year -f/u with PCP for incidental findings on last CT  All questions were answered. The patient knows to call the clinic with any problems, questions or concerns. No barriers to learning was detected.    Alla Feeling, NP 10/08/17

## 2017-10-13 DIAGNOSIS — H353132 Nonexudative age-related macular degeneration, bilateral, intermediate dry stage: Secondary | ICD-10-CM | POA: Diagnosis not present

## 2017-10-20 DIAGNOSIS — R1030 Lower abdominal pain, unspecified: Secondary | ICD-10-CM | POA: Diagnosis not present

## 2017-10-20 DIAGNOSIS — N949 Unspecified condition associated with female genital organs and menstrual cycle: Secondary | ICD-10-CM | POA: Diagnosis not present

## 2017-10-20 DIAGNOSIS — R935 Abnormal findings on diagnostic imaging of other abdominal regions, including retroperitoneum: Secondary | ICD-10-CM | POA: Diagnosis not present

## 2017-12-25 DIAGNOSIS — M545 Low back pain: Secondary | ICD-10-CM | POA: Diagnosis not present

## 2017-12-25 DIAGNOSIS — Z79899 Other long term (current) drug therapy: Secondary | ICD-10-CM | POA: Diagnosis not present

## 2017-12-25 DIAGNOSIS — M961 Postlaminectomy syndrome, not elsewhere classified: Secondary | ICD-10-CM | POA: Diagnosis not present

## 2017-12-25 DIAGNOSIS — F341 Dysthymic disorder: Secondary | ICD-10-CM | POA: Diagnosis not present

## 2017-12-25 DIAGNOSIS — R52 Pain, unspecified: Secondary | ICD-10-CM | POA: Diagnosis not present

## 2017-12-25 DIAGNOSIS — I10 Essential (primary) hypertension: Secondary | ICD-10-CM | POA: Diagnosis not present

## 2017-12-25 DIAGNOSIS — Z6830 Body mass index (BMI) 30.0-30.9, adult: Secondary | ICD-10-CM | POA: Diagnosis not present

## 2017-12-25 DIAGNOSIS — F411 Generalized anxiety disorder: Secondary | ICD-10-CM | POA: Diagnosis not present

## 2018-01-06 ENCOUNTER — Encounter (HOSPITAL_COMMUNITY): Payer: Self-pay | Admitting: Family Medicine

## 2018-01-06 ENCOUNTER — Ambulatory Visit (HOSPITAL_COMMUNITY)
Admission: EM | Admit: 2018-01-06 | Discharge: 2018-01-06 | Disposition: A | Discharge status: Home / Self Care | Payer: Medicare Other | Attending: Family Medicine | Admitting: Family Medicine

## 2018-01-06 DIAGNOSIS — Z79899 Other long term (current) drug therapy: Secondary | ICD-10-CM | POA: Insufficient documentation

## 2018-01-06 DIAGNOSIS — Z7982 Long term (current) use of aspirin: Secondary | ICD-10-CM | POA: Insufficient documentation

## 2018-01-06 DIAGNOSIS — I252 Old myocardial infarction: Secondary | ICD-10-CM | POA: Diagnosis not present

## 2018-01-06 DIAGNOSIS — N390 Urinary tract infection, site not specified: Secondary | ICD-10-CM | POA: Diagnosis not present

## 2018-01-06 DIAGNOSIS — I10 Essential (primary) hypertension: Secondary | ICD-10-CM | POA: Diagnosis not present

## 2018-01-06 LAB — POCT URINALYSIS DIP (DEVICE)
Bilirubin Urine: NEGATIVE
GLUCOSE, UA: NEGATIVE mg/dL
KETONES UR: NEGATIVE mg/dL
Nitrite: NEGATIVE
PROTEIN: NEGATIVE mg/dL
Specific Gravity, Urine: <=1.005 (ref 1.005–1.030)
Urobilinogen, UA: 0.2 mg/dL (ref 0.0–1.0)
pH: 5.5 (ref 5.0–8.0)

## 2018-01-06 MED ORDER — CEPHALEXIN 500 MG PO CAPS: 500.0000 mg | ORAL_CAPSULE | Freq: Three times a day (TID) | ORAL | 0 refills | Status: DC

## 2018-01-06 NOTE — ED Triage Notes (Signed)
Pt c/o UTI symptoms, states she took a dipstick and it stated she had a UTI

## 2018-01-06 NOTE — ED Provider Notes (Signed)
[place]   [id number] 01/06/18 Arrival Time: 1552   SUBJECTIVE:  Emma Buchanan is a 77 y.o. female who presents to the urgent care with complaint of UTI symptoms, states she took a dipstick and it suggested that she had a UTI.  No fever, nausea, recurrent UTI hx  Past Medical History:  Diagnosis Date  . Cancer ([place])   . H/O resection of liver   . H/O shoulder surgery   . Hypertension   . Myocardial infarction [facility])    Family History  Problem Relation Age of Onset  . Cancer Mother 34       bladder cancer  . Cancer Brother        throat cancer   . Cancer Brother        pancreatic cancer    Social History   Socioeconomic History  . Marital status: Widowed    Spouse name: Not on file  . Number of children: Not on file  . Years of education: Not on file  . Highest education level: Not on file  Occupational History  . Not on file  Social Needs  . Financial resource strain: Not on file  . Food insecurity:    Worry: Not on file    Inability: Not on file  . Transportation needs:    Medical: Not on file    Non-medical: Not on file  Tobacco Use  . Smoking status: Never Smoker  . Smokeless tobacco: Never Used  Substance and Sexual Activity  . Alcohol use: No  . Drug use: No  . Sexual activity: Not on file  Lifestyle  . Physical activity:    Days per week: Not on file    Minutes per session: Not on file  . Stress: Not on file  Relationships  . Social connections:    Talks on phone: Not on file    Gets together: Not on file    Attends religious service: Not on file    Active member of club or organization: Not on file    Attends meetings of clubs or organizations: Not on file    Relationship status: Not on file  . Intimate partner violence:    Fear of current or ex partner: Not on file    Emotionally abused: Not on file    Physically abused: Not on file    Forced sexual activity: Not on file  Other Topics Concern  . Not on file  Social  History Narrative  . Not on file   No outpatient medications have been marked as taking for the 01/06/18 encounter [facility] Encounter).   Allergies  Allergen Reactions  . Duloxetine Nausea And Vomiting    Cause severe pain  . Meperidine Nausea And Vomiting  . Nubain [Nalbuphine Hcl]    No current facility-administered medications for this encounter.   Current Outpatient Medications:  .  alendronate (FOSAMAX) 70 MG tablet, Take 70 mg by mouth once a week., Disp: , Rfl: 1 .  aspirin 81 MG tablet, Take 81 mg by mouth daily., Disp: , Rfl:  .  cephALEXin (KEFLEX) 500 MG capsule, Take 1 capsule (500 mg total) by mouth 3 (three) times daily., Disp: 20 capsule, Rfl: 0 .  Cholecalciferol (HM VITAMIN D3) 2000 units CAPS, Take 1 capsule by mouth daily., Disp: , Rfl:  .  DAILY MULTIPLE VITAMINS tablet, Take 1 tablet by mouth daily., Disp: , Rfl:  .  LORazepam (ATIVAN) 0.5 MG tablet, Take 0.5 mg by mouth at bedtime  as needed., Disp: , Rfl:  .  metoprolol (LOPRESSOR) 50 MG tablet, Take 50 mg by mouth 2 (two) times daily., Disp: , Rfl:  .  olmesartan (BENICAR) 20 MG tablet, , Disp: , Rfl: 1 .  RABEprazole (ACIPHEX) 20 MG tablet, Take 20 mg by mouth daily as needed., Disp: , Rfl: 1 .  traMADol (ULTRAM) 50 MG tablet, Take 50 mg by mouth 4 (four) times daily. , Disp: , Rfl: 0 .  triamcinolone cream (KENALOG) 0.1 %, Apply 1 application topically 2 (two) times daily., Disp: 30 g, Rfl: 0   ROS: As per HPI, remainder of ROS negative.   OBJECTIVE:   Vitals:   01/06/18 1605  BP: 135/77  Pulse: 89  Resp: 16  Temp: 97.9 F (36.6 C)  SpO2: 97%     General appearance: alert; no distress Eyes: PERRL; EOMI; conjunctiva normal HENT: normocephalic; atraumatic;  Neck: supple Back: no CVA tenderness Extremities: no cyanosis or edema; symmetrical with no gross deformities Skin: warm and dry Neurologic: normal gait; grossly normal Psychological: alert and cooperative; normal mood and  affect      Labs:  Results for orders placed or performed during the hospital encounter of 01/06/18  POCT urinalysis dip (device)  Result Value Ref Range   Glucose, UA NEGATIVE NEGATIVE mg/dL   Bilirubin Urine NEGATIVE NEGATIVE   Ketones, ur NEGATIVE NEGATIVE mg/dL   Specific Gravity, Urine <=1.005 1.005 - 1.030   Hgb urine dipstick MODERATE (A) NEGATIVE   pH 5.5 5.0 - 8.0   Protein, ur NEGATIVE NEGATIVE mg/dL   Urobilinogen, UA 0.2 0.0 - 1.0 mg/dL   Nitrite NEGATIVE NEGATIVE   Leukocytes, UA SMALL (A) NEGATIVE    Labs Reviewed  POCT URINALYSIS DIP (DEVICE) - Abnormal; Notable for the following components:      Result Value   Hgb urine dipstick MODERATE (*)    Leukocytes, UA SMALL (*)    All other components within normal limits  URINE CULTURE    No results found.     ASSESSMENT & PLAN:  1. Lower urinary tract infectious disease     Meds ordered this encounter  Medications  . cephALEXin (KEFLEX) 500 MG capsule    Sig: Take 1 capsule (500 mg total) by mouth 3 (three) times daily.    Dispense:  20 capsule    Refill:  0    Reviewed expectations re: course of current medical issues. Questions answered. Outlined signs and symptoms indicating need for more acute intervention. Patient verbalized understanding. After Visit Summary given.    Procedures:      Robyn Haber, MD 01/06/18 1625

## 2018-01-07 LAB — URINE CULTURE: Culture: NO GROWTH

## 2018-01-08 ENCOUNTER — Telehealth (HOSPITAL_COMMUNITY): Payer: Self-pay

## 2018-01-08 NOTE — Telephone Encounter (Signed)
Pt called and instructed to stop taking antibiotic based on urine culture.

## 2018-01-20 DIAGNOSIS — I10 Essential (primary) hypertension: Secondary | ICD-10-CM | POA: Diagnosis not present

## 2018-01-20 DIAGNOSIS — R52 Pain, unspecified: Secondary | ICD-10-CM | POA: Diagnosis not present

## 2018-01-27 DIAGNOSIS — M150 Primary generalized (osteo)arthritis: Secondary | ICD-10-CM | POA: Diagnosis not present

## 2018-01-27 DIAGNOSIS — G453 Amaurosis fugax: Secondary | ICD-10-CM | POA: Diagnosis not present

## 2018-01-27 DIAGNOSIS — I10 Essential (primary) hypertension: Secondary | ICD-10-CM | POA: Diagnosis not present

## 2018-01-27 DIAGNOSIS — N3941 Urge incontinence: Secondary | ICD-10-CM | POA: Diagnosis not present

## 2018-01-27 DIAGNOSIS — Z85060 Personal history of malignant carcinoid tumor of small intestine: Secondary | ICD-10-CM | POA: Diagnosis not present

## 2018-02-05 ENCOUNTER — Other Ambulatory Visit: Payer: Self-pay | Admitting: Family Medicine

## 2018-02-05 DIAGNOSIS — G453 Amaurosis fugax: Secondary | ICD-10-CM

## 2018-02-09 ENCOUNTER — Ambulatory Visit
Admission: RE | Admit: 2018-02-09 | Discharge: 2018-02-09 | Disposition: A | Discharge status: Home / Self Care | Payer: Medicare Other | Source: Ambulatory Visit | Attending: Family Medicine | Admitting: Family Medicine

## 2018-02-09 DIAGNOSIS — I6522 Occlusion and stenosis of left carotid artery: Secondary | ICD-10-CM | POA: Diagnosis not present

## 2018-02-09 DIAGNOSIS — G453 Amaurosis fugax: Secondary | ICD-10-CM

## 2018-03-24 DIAGNOSIS — I10 Essential (primary) hypertension: Secondary | ICD-10-CM | POA: Diagnosis not present

## 2018-04-13 DIAGNOSIS — H43813 Vitreous degeneration, bilateral: Secondary | ICD-10-CM | POA: Diagnosis not present

## 2018-04-13 DIAGNOSIS — H40011 Open angle with borderline findings, low risk, right eye: Secondary | ICD-10-CM | POA: Diagnosis not present

## 2018-04-13 DIAGNOSIS — H04123 Dry eye syndrome of bilateral lacrimal glands: Secondary | ICD-10-CM | POA: Diagnosis not present

## 2018-04-13 DIAGNOSIS — H353132 Nonexudative age-related macular degeneration, bilateral, intermediate dry stage: Secondary | ICD-10-CM | POA: Diagnosis not present

## 2018-04-13 DIAGNOSIS — H25013 Cortical age-related cataract, bilateral: Secondary | ICD-10-CM | POA: Diagnosis not present

## 2018-04-14 ENCOUNTER — Telehealth: Payer: Self-pay

## 2018-04-14 NOTE — Telephone Encounter (Signed)
Patient calls regarding a lesion on the right side of her face near her ear.  It is dark, irregular and has been present for probably 2 months.  Patient wants to know if Dr. Burr Medico can take a look at it and tell her if she thinks it is cancerous.  [phone]

## 2018-04-14 NOTE — Telephone Encounter (Signed)
Called patient per Dr. Burr Medico instructed her to follow up with PCP or dermatologist concerning lesion on her face, patient verbalized an understanding.

## 2018-04-14 NOTE — Telephone Encounter (Signed)
I think it's better for her to see PCP for skin lesion, it's unlikely related to her previous carcinoid tumor, and we do not treat skin cancer. Thanks .   Truitt Merle MD

## 2018-05-01 DIAGNOSIS — M150 Primary generalized (osteo)arthritis: Secondary | ICD-10-CM | POA: Diagnosis not present

## 2018-05-01 DIAGNOSIS — D2272 Melanocytic nevi of left lower limb, including hip: Secondary | ICD-10-CM | POA: Diagnosis not present

## 2018-05-01 DIAGNOSIS — I10 Essential (primary) hypertension: Secondary | ICD-10-CM | POA: Diagnosis not present

## 2018-05-06 DIAGNOSIS — D1800 Hemangioma unspecified site: Secondary | ICD-10-CM | POA: Diagnosis not present

## 2018-05-06 DIAGNOSIS — D2272 Melanocytic nevi of left lower limb, including hip: Secondary | ICD-10-CM | POA: Diagnosis not present

## 2018-06-09 DIAGNOSIS — J358 Other chronic diseases of tonsils and adenoids: Secondary | ICD-10-CM | POA: Diagnosis not present

## 2018-07-22 DIAGNOSIS — Z136 Encounter for screening for cardiovascular disorders: Secondary | ICD-10-CM | POA: Diagnosis not present

## 2018-07-22 DIAGNOSIS — M810 Age-related osteoporosis without current pathological fracture: Secondary | ICD-10-CM | POA: Diagnosis not present

## 2018-07-22 DIAGNOSIS — Z23 Encounter for immunization: Secondary | ICD-10-CM | POA: Diagnosis not present

## 2018-07-22 DIAGNOSIS — M25512 Pain in left shoulder: Secondary | ICD-10-CM | POA: Diagnosis not present

## 2018-07-22 DIAGNOSIS — Z1239 Encounter for other screening for malignant neoplasm of breast: Secondary | ICD-10-CM | POA: Diagnosis not present

## 2018-07-22 DIAGNOSIS — I10 Essential (primary) hypertension: Secondary | ICD-10-CM | POA: Diagnosis not present

## 2018-07-22 DIAGNOSIS — I491 Atrial premature depolarization: Secondary | ICD-10-CM | POA: Diagnosis not present

## 2018-07-22 DIAGNOSIS — M67479 Ganglion, unspecified ankle and foot: Secondary | ICD-10-CM | POA: Diagnosis not present

## 2018-07-23 ENCOUNTER — Other Ambulatory Visit: Payer: Self-pay | Admitting: Nurse Practitioner

## 2018-07-23 DIAGNOSIS — M810 Age-related osteoporosis without current pathological fracture: Secondary | ICD-10-CM

## 2018-07-23 DIAGNOSIS — Z1239 Encounter for other screening for malignant neoplasm of breast: Secondary | ICD-10-CM

## 2018-07-23 DIAGNOSIS — Z1231 Encounter for screening mammogram for malignant neoplasm of breast: Secondary | ICD-10-CM

## 2018-07-28 DIAGNOSIS — I10 Essential (primary) hypertension: Secondary | ICD-10-CM | POA: Diagnosis not present

## 2018-07-28 DIAGNOSIS — R0789 Other chest pain: Secondary | ICD-10-CM | POA: Diagnosis not present

## 2018-07-30 ENCOUNTER — Ambulatory Visit
Admission: RE | Admit: 2018-07-30 | Discharge: 2018-07-30 | Disposition: A | Discharge status: Home / Self Care | Payer: Medicare Other | Source: Ambulatory Visit | Attending: Nurse Practitioner | Admitting: Nurse Practitioner

## 2018-07-30 DIAGNOSIS — M810 Age-related osteoporosis without current pathological fracture: Secondary | ICD-10-CM | POA: Diagnosis not present

## 2018-07-30 DIAGNOSIS — Z780 Asymptomatic menopausal state: Secondary | ICD-10-CM | POA: Diagnosis not present

## 2018-08-27 DIAGNOSIS — M25512 Pain in left shoulder: Secondary | ICD-10-CM | POA: Diagnosis not present

## 2018-09-02 ENCOUNTER — Ambulatory Visit: Payer: Medicare Other

## 2018-09-02 ENCOUNTER — Ambulatory Visit
Admission: RE | Admit: 2018-09-02 | Discharge: 2018-09-02 | Disposition: A | Discharge status: Home / Self Care | Payer: Medicare Other | Source: Ambulatory Visit | Attending: Nurse Practitioner | Admitting: Nurse Practitioner

## 2018-09-02 DIAGNOSIS — Z1231 Encounter for screening mammogram for malignant neoplasm of breast: Secondary | ICD-10-CM | POA: Diagnosis not present

## 2018-10-01 ENCOUNTER — Inpatient Hospital Stay: Payer: Medicare Other | Attending: Hematology

## 2018-10-01 DIAGNOSIS — M549 Dorsalgia, unspecified: Secondary | ICD-10-CM | POA: Insufficient documentation

## 2018-10-01 DIAGNOSIS — I10 Essential (primary) hypertension: Secondary | ICD-10-CM | POA: Insufficient documentation

## 2018-10-01 DIAGNOSIS — Z7982 Long term (current) use of aspirin: Secondary | ICD-10-CM | POA: Insufficient documentation

## 2018-10-01 DIAGNOSIS — Z79899 Other long term (current) drug therapy: Secondary | ICD-10-CM | POA: Insufficient documentation

## 2018-10-01 DIAGNOSIS — C7A019 Malignant carcinoid tumor of the small intestine, unspecified portion: Secondary | ICD-10-CM | POA: Diagnosis not present

## 2018-10-01 LAB — CBC WITH DIFFERENTIAL/PLATELET
Abs Immature Granulocytes: 0.02 10*3/uL (ref 0.00–0.07)
BASOS PCT: 0 %
Basophils Absolute: 0 10*3/uL (ref 0.0–0.1)
Eosinophils Absolute: 0.2 10*3/uL (ref 0.0–0.5)
Eosinophils Relative: 2 %
HEMATOCRIT: 40 % (ref 36.0–46.0)
Hemoglobin: 13 g/dL (ref 12.0–15.0)
Immature Granulocytes: 0 %
Lymphocytes Relative: 26 %
Lymphs Abs: 2 10*3/uL (ref 0.7–4.0)
MCH: 29.6 pg (ref 26.0–34.0)
MCHC: 32.5 g/dL (ref 30.0–36.0)
MCV: 91.1 fL (ref 80.0–100.0)
Monocytes Absolute: 0.7 10*3/uL (ref 0.1–1.0)
Monocytes Relative: 9 %
NEUTROS ABS: 4.7 10*3/uL (ref 1.7–7.7)
NEUTROS PCT: 63 %
Platelets: 190 10*3/uL (ref 150–400)
RBC: 4.39 MIL/uL (ref 3.87–5.11)
RDW: 13.3 % (ref 11.5–15.5)
WBC: 7.5 10*3/uL (ref 4.0–10.5)
nRBC: 0 % (ref 0.0–0.2)

## 2018-10-01 LAB — COMPREHENSIVE METABOLIC PANEL
ALBUMIN: 3.8 g/dL (ref 3.5–5.0)
ALT: 16 U/L (ref 0–44)
ANION GAP: 8 (ref 5–15)
AST: 21 U/L (ref 15–41)
Alkaline Phosphatase: 90 U/L (ref 38–126)
BUN: 12 mg/dL (ref 8–23)
CO2: 25 mmol/L (ref 22–32)
Calcium: 9.2 mg/dL (ref 8.9–10.3)
Chloride: 106 mmol/L (ref 98–111)
Creatinine, Ser: 0.74 mg/dL (ref 0.44–1.00)
GFR calc Af Amer: >60 mL/min (ref >60)
Glucose, Bld: 93 mg/dL (ref 70–99)
POTASSIUM: 4.3 mmol/L (ref 3.5–5.1)
Sodium: 139 mmol/L (ref 135–145)
TOTAL PROTEIN: 6.7 g/dL (ref 6.5–8.1)
Total Bilirubin: 0.4 mg/dL (ref 0.3–1.2)

## 2018-10-02 LAB — CHROMOGRANIN A: Chromogranin A: 84.1 ng/mL (ref 0.0–101.8)

## 2018-10-06 NOTE — Progress Notes (Signed)
[place]   [phone] [phone]   Clinic Follow up Note   Patient Care Team: Dianna Rossetti, NP as PCP - General (Nurse Practitioner) 10/08/2018  CHIEF COMPLAINT: F/u on malignant carcinoid tumor of the small bowel   SUMMARY OF ONCOLOGIC HISTORY: Oncology History   Presented w/flushing abdominal pain     Malignant carcinoid tumor of small intestine ([place])   12/23/2007 Initial Diagnosis    Malignant carcinoid tumor of small intestine ([place])    12/23/2007 Definitive Surgery    Resection of primary tumor in small intestine and lobectomy at [facility] 2009--Stage IV    10/03/2015 Tumor Marker    Chromogranin A=2.0    11/15/2015 Imaging    CT ABDOMEN/PELVIS: Diffuse hepatic steatosis, no focal lesions; high-grade SBO likely due to adhesions; mild mesenteric edema and trace ascites ([place])    10/22/2016 Imaging    CT Abdomen pelvis W Contrast  IMPRESSION: 1. No acute findings and no specific features identified to suggest recurrent tumor or metastatic disease. 2. Status post partial right hepatectomy. 3. Aortic atherosclerosis.    08/20/2017 Imaging    CT Abdomen pelvis WO Contrast  IMPRESSION: No acute findings within the abdomen. Stable postsurgical changes of right hepatectomy and distal small bowel resection. Prominence of bilateral adnexal regions, with uncertain significance.      CURRENT THERAPY Surveillance   INTERVAL HISTORY: Emma Buchanan is a 78 y.o. female who is here for follow-up. Today, she is here alone. She is doing well. She says that her short term memory is worsening. She denies bloating or abdominal pain. No changes in BM. She has one BM a day and is usually loose. She noticed RLQ pain that radiated to her back. The pain started almost 6 months ago and is intermittent. The pain is worsened by walking, and she thinks it might be related to her hip and lower back surgery. She takes  tramadol BID and tylenol as needed.   Pertinent positives and negatives of review of systems are listed and detailed within the above HPI.  REVIEW OF SYSTEMS:   Constitutional: Denies fevers, chills or abnormal weight loss (+) worsening short term memory Eyes: Denies blurriness of vision Ears, nose, mouth, throat, and face: Denies mucositis or sore throat Respiratory: Denies cough, dyspnea or wheezes Cardiovascular: Denies palpitation, chest discomfort or lower extremity swelling Gastrointestinal:  Denies nausea, heartburn or change in bowel habits (+) RLQ abdominal pain Skin: Denies abnormal skin rashes Lymphatics: Denies new lymphadenopathy or easy bruising Neurological:Denies numbness, tingling or new weaknesses MSK: (+) right hip and back pain Behavioral/Psych: Mood is stable, no new changes  All other systems were reviewed with the patient and are negative.  MEDICAL HISTORY:  Past Medical History:  Diagnosis Date  . Cancer ([place])   . H/O resection of liver   . H/O shoulder surgery   . Hypertension   . Myocardial infarction [facility])     SURGICAL HISTORY: Past Surgical History:  Procedure Laterality Date  . CERVICAL SPINE SURGERY    . CHOLECYSTECTOMY    . LIVER RESECTION    . SHOULDER SURGERY    . SMALL INTESTINE SURGERY      I have reviewed the social history and family history with the patient and they are unchanged from previous note.  ALLERGIES:  is allergic to duloxetine; meperidine; and nubain [nalbuphine hcl].  MEDICATIONS:  Current Outpatient Medications  Medication Sig Dispense Refill  . aspirin 81 MG tablet Take  81 mg by mouth daily.    . Cholecalciferol (HM VITAMIN D3) 2000 units CAPS Take 1 capsule by mouth daily.    Marland Kitchen DAILY MULTIPLE VITAMINS tablet Take 1 tablet by mouth daily.    Marland Kitchen LORazepam (ATIVAN) 0.5 MG tablet Take 0.5 mg by mouth at bedtime as needed.    . metoprolol (LOPRESSOR) 50 MG tablet Take 50 mg by mouth 2 (two) times daily.    Marland Kitchen olmesartan  (BENICAR) 20 MG tablet   1  . RABEprazole (ACIPHEX) 20 MG tablet Take 20 mg by mouth daily as needed.  1  . traMADol (ULTRAM) 50 MG tablet Take 50 mg by mouth 4 (four) times daily.   0  . triamcinolone cream (KENALOG) 0.1 % Apply 1 application topically 2 (two) times daily. 30 g 0   No current facility-administered medications for this visit.     PHYSICAL EXAMINATION: ECOG PERFORMANCE STATUS: 1 - Symptomatic but completely ambulatory  Vitals:   10/08/18 1306  BP: 131/85  Pulse: 74  Resp: 18  Temp: 98.2 F (36.8 C)  SpO2: 95%   Filed Weights   10/08/18 1306  Weight: 178 lb 14.4 oz (81.1 kg)    GENERAL:alert, no distress and comfortable SKIN: skin color, texture, turgor are normal, no rashes or significant lesions EYES: normal, Conjunctiva are pink and non-injected, sclera clear OROPHARYNX:no exudate, no erythema and lips, buccal mucosa, and tongue normal  NECK: supple, thyroid normal size, non-tender, without nodularity LYMPH:  no palpable lymphadenopathy in the cervical, axillary or inguinal LUNGS: clear to auscultation and percussion with normal breathing effort HEART: regular rate & rhythm and no murmurs and no lower extremity edema ABDOMEN:abdomen soft, non-tender and normal bowel sounds (+) right sided abdominal pain. No organomegaly (+) surgical scar healed well. Musculoskeletal:no cyanosis of digits and no clubbing (+) right gluteal pain  NEURO: alert & oriented x 3 with fluent speech, no focal motor/sensory deficits  LABORATORY DATA:  I have reviewed the data as listed CBC Latest Ref Rng & Units 10/08/2018 10/01/2018 10/01/2017  WBC 4.0 - 10.5 K/uL 7.9 7.5 7.1  Hemoglobin 12.0 - 15.0 g/dL 13.4 13.0 14.6  Hematocrit 36.0 - 46.0 % 41.4 40.0 45.2  Platelets 150 - 400 K/uL 206 190 195     CMP Latest Ref Rng & Units 10/08/2018 10/01/2018 10/01/2017  Glucose 70 - 99 mg/dL 89 93 124  BUN 8 - 23 mg/dL 14 12 11   Creatinine 0.44 - 1.00 mg/dL 0.84 0.74 0.82  Sodium 135 - 145  mmol/L 138 139 140  Potassium 3.5 - 5.1 mmol/L 4.1 4.3 4.2  Chloride 98 - 111 mmol/L 103 106 105  CO2 22 - 32 mmol/L 26 25 27   Calcium 8.9 - 10.3 mg/dL 9.3 9.2 9.4  Total Protein 6.5 - 8.1 g/dL 7.1 6.7 7.0  Total Bilirubin 0.3 - 1.2 mg/dL 0.6 0.4 0.7  Alkaline Phos 38 - 126 U/L 91 90 85  AST 15 - 41 U/L 22 21 23   ALT 0 - 44 U/L 16 16 18       RADIOGRAPHIC STUDIES: I have personally reviewed the radiological images as listed and agreed with the findings in the report. No results found.   ASSESSMENT & PLAN:  Emma Buchanan is a 78 y.o. 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 Treated with surgery. Currently on surveillance. NED -Labs reviewed, CBC and CMP are WNLs. Chromogranin A one week ago was normal. -  She is clinically stable and doing well. She has one loose BM a day. -She also has RLQ abdominal pain that she relates to her previous right hip and back surgeries.  She is scheduled to see his orthopedic surgeon . I do not have high suspicion for tumor recurrence, but it's also reasonable to get a scan if needed  -f/u in 8 months, and I will discharge her after next visit if no additional concern.  2. HTN -f/u with PCP  3. Hip and Back pain -She had right hip and back surgeries in the past, worse lately  -She takes Tramadol BID and tylenol as needed -I asked her to talk to her talk to her orthopedic surgeon to see if she needs a image study   Plan  -She is clinically stable. Continue surveillance -f/u in 8 months with labs one week before   No problem-specific Assessment & Plan notes found for this encounter.   No orders of the defined types were placed in this encounter.  All questions were answered. The patient knows to call the clinic with any problems, questions or concerns. No barriers to learning was detected. I spent 15 minutes counseling the patient face to face. The total  time spent in the appointment was 20 minutes and more than 50% was on counseling and review of test results  I, Noor Dweik am acting as scribe for Dr. Truitt Merle.  I have reviewed the above documentation for accuracy and completeness, and I agree with the above.     Truitt Merle, MD 10/08/2018

## 2018-10-08 ENCOUNTER — Encounter: Payer: Self-pay | Admitting: Hematology

## 2018-10-08 ENCOUNTER — Inpatient Hospital Stay: Payer: Medicare Other

## 2018-10-08 ENCOUNTER — Inpatient Hospital Stay (HOSPITAL_BASED_OUTPATIENT_CLINIC_OR_DEPARTMENT_OTHER): Payer: Medicare Other | Admitting: Hematology

## 2018-10-08 VITALS — BP 131/85 | HR 74 | Temp 98.2°F | Resp 18 | Ht 64.0 in | Wt 178.9 lb

## 2018-10-08 DIAGNOSIS — Z79899 Other long term (current) drug therapy: Secondary | ICD-10-CM

## 2018-10-08 DIAGNOSIS — C7A019 Malignant carcinoid tumor of the small intestine, unspecified portion: Secondary | ICD-10-CM

## 2018-10-08 DIAGNOSIS — M549 Dorsalgia, unspecified: Secondary | ICD-10-CM

## 2018-10-08 DIAGNOSIS — Z7982 Long term (current) use of aspirin: Secondary | ICD-10-CM

## 2018-10-08 DIAGNOSIS — I10 Essential (primary) hypertension: Secondary | ICD-10-CM | POA: Diagnosis not present

## 2018-10-08 LAB — COMPREHENSIVE METABOLIC PANEL
ALT: 16 U/L (ref 0–44)
AST: 22 U/L (ref 15–41)
Albumin: 4 g/dL (ref 3.5–5.0)
Alkaline Phosphatase: 91 U/L (ref 38–126)
Anion gap: 9 (ref 5–15)
BUN: 14 mg/dL (ref 8–23)
CO2: 26 mmol/L (ref 22–32)
Calcium: 9.3 mg/dL (ref 8.9–10.3)
Chloride: 103 mmol/L (ref 98–111)
Creatinine, Ser: 0.84 mg/dL (ref 0.44–1.00)
GFR calc Af Amer: >60 mL/min (ref >60)
Glucose, Bld: 89 mg/dL (ref 70–99)
POTASSIUM: 4.1 mmol/L (ref 3.5–5.1)
Sodium: 138 mmol/L (ref 135–145)
Total Bilirubin: 0.6 mg/dL (ref 0.3–1.2)
Total Protein: 7.1 g/dL (ref 6.5–8.1)

## 2018-10-08 LAB — CBC WITH DIFFERENTIAL/PLATELET
ABS IMMATURE GRANULOCYTES: 0.02 10*3/uL (ref 0.00–0.07)
BASOS ABS: 0 10*3/uL (ref 0.0–0.1)
Basophils Relative: 1 %
Eosinophils Absolute: 0.2 10*3/uL (ref 0.0–0.5)
Eosinophils Relative: 2 %
HCT: 41.4 % (ref 36.0–46.0)
HEMOGLOBIN: 13.4 g/dL (ref 12.0–15.0)
Immature Granulocytes: 0 %
LYMPHS PCT: 30 %
Lymphs Abs: 2.3 10*3/uL (ref 0.7–4.0)
MCH: 30 pg (ref 26.0–34.0)
MCHC: 32.4 g/dL (ref 30.0–36.0)
MCV: 92.6 fL (ref 80.0–100.0)
Monocytes Absolute: 0.8 10*3/uL (ref 0.1–1.0)
Monocytes Relative: 10 %
NRBC: 0 % (ref 0.0–0.2)
Neutro Abs: 4.5 10*3/uL (ref 1.7–7.7)
Neutrophils Relative %: 57 %
Platelets: 206 10*3/uL (ref 150–400)
RBC: 4.47 MIL/uL (ref 3.87–5.11)
RDW: 13.5 % (ref 11.5–15.5)
WBC: 7.9 10*3/uL (ref 4.0–10.5)

## 2018-10-09 ENCOUNTER — Telehealth: Payer: Self-pay | Admitting: Hematology

## 2018-10-09 NOTE — Telephone Encounter (Signed)
Called patient about scheduled appts per 01/16 los. Patient did not answer.  Printed and mailed calendar.

## 2018-11-11 ENCOUNTER — Other Ambulatory Visit: Payer: Self-pay | Admitting: Hematology

## 2018-11-11 DIAGNOSIS — R350 Frequency of micturition: Secondary | ICD-10-CM | POA: Diagnosis not present

## 2018-11-11 DIAGNOSIS — C7A019 Malignant carcinoid tumor of the small intestine, unspecified portion: Secondary | ICD-10-CM

## 2018-12-29 ENCOUNTER — Other Ambulatory Visit: Payer: Self-pay | Admitting: Cardiology

## 2019-01-06 DIAGNOSIS — I10 Essential (primary) hypertension: Secondary | ICD-10-CM | POA: Diagnosis not present

## 2019-01-06 DIAGNOSIS — N3941 Urge incontinence: Secondary | ICD-10-CM | POA: Diagnosis not present

## 2019-01-24 DIAGNOSIS — R0789 Other chest pain: Secondary | ICD-10-CM | POA: Insufficient documentation

## 2019-01-24 NOTE — Progress Notes (Signed)
Subjective:  Primary Physician:  Dianna Rossetti, NP (Inactive)  Patient ID: Emma Buchanan, female    DOB: 02/11/1941, 78 y.o.   MRN: [id number] This visit type was conducted due to national recommendations for restrictions regarding the COVID-19 Pandemic (e.g. social distancing).  This format is felt to be most appropriate for this patient at this time.  All issues noted in this document were discussed and addressed.  No physical exam was performed (except for noted visual exam findings with Telehealth visits - very limited).  The patient has consented to conduct a Telehealth visit and understands insurance will be billed.   I connected with patient, on 01/26/19  by a video enabled telemedicine application and verified that I am speaking with the correct person using two identifiers.     I discussed the limitations of evaluation and management by telemedicine and the availability of in person appointments. The patient expressed understanding and agreed to proceed.   I have discussed with patient regarding the safety during COVID Pandemic and steps and precautions including social distancing with the patient.    Chief Complaint  Patient presents with  . Hypertension  . Follow-up    78mth    HPI: Emma Buchanan  is a 78 y.o. female. She presents for follow-up for hypertension. Patient is complaining of mild generalized body aches. During last visit she was concerned if it is due to side effects of olmesartan. I had explained to her that it was unlikely but  she was advised to stop olmesartan.  However, she is still taking half tablet every day. Her blood pressure has been well controlled, systolic pressure at home has been mostly in 818H and diastolic in 63J.  She also has chronic aching in her left shoulder. She has had shoulder surgery in the past. Patient said that because of continued chronic pain, she has been advised to have another shoulder surgery but she does not want to  do it. Occasionally she has aching type left-sided chest pain with shoulder pain and is exacerbated with certain movements of her left shoulder.  No history of exertional chest pain. No shortness of breath, orthopnea or PND. No palpitations, dizziness, near-syncope or syncope.  No history of diabetes or hypercholesterolemia. She does not smoke. She walks for about 10 minutes every day.  She had normal echocardiogram in 2017 and normal Lexiscan nuclear stress testing in 2018. 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      Past Medical History:  Diagnosis Date  . Cancer ([place])   . H/O resection of liver   . H/O shoulder surgery   . Hypertension   . Myocardial infarction [facility])     Past Surgical History:  Procedure Laterality Date  . CERVICAL SPINE SURGERY    . CHOLECYSTECTOMY    . LIVER RESECTION    . SHOULDER SURGERY    . SMALL INTESTINE SURGERY      Social History   Socioeconomic History  . Marital status: Widowed    Spouse name: Not on file  . Number of children: 3  . Years of education: Not on file  . Highest education level: Not on file  Occupational History  . Not on file  Social Needs  . Financial resource strain: Not on file  . Food insecurity:    Worry: Not on file  Inability: Not on file  . Transportation needs:    Medical: Not on file    Non-medical: Not on file  Tobacco Use  . Smoking status: Never Smoker  . Smokeless tobacco: Never Used  Substance and Sexual Activity  . Alcohol use: No  . Drug use: No  . Sexual activity: Not on file  Lifestyle  . Physical activity:    Days per week: Not on file    Minutes per session: Not on file  . Stress: Not on file  Relationships  . Social connections:    Talks on phone: Not on file    Gets together: Not on file    Attends religious service: Not on file    Active  member of club or organization: Not on file    Attends meetings of clubs or organizations: Not on file    Relationship status: Not on file  . Intimate partner violence:    Fear of current or ex partner: Not on file    Emotionally abused: Not on file    Physically abused: Not on file    Forced sexual activity: Not on file  Other Topics Concern  . Not on file  Social History Narrative  . Not on file    Current Outpatient Medications on File Prior to Visit  Medication Sig Dispense Refill  . Acetaminophen (TYLENOL 8 HOUR ARTHRITIS PAIN PO) Take 1 tablet by mouth as needed.    Marland Kitchen aspirin 81 MG tablet Take 81 mg by mouth daily.    . Cholecalciferol (HM VITAMIN D3) 2000 units CAPS Take 1 capsule by mouth daily.    Marland Kitchen DAILY MULTIPLE VITAMINS tablet Take 1 tablet by mouth daily.    Marland Kitchen LORazepam (ATIVAN) 0.5 MG tablet Take 0.5 mg by mouth at bedtime as needed.    . metoprolol (LOPRESSOR) 50 MG tablet Take 50 mg by mouth 2 (two) times daily.    Marland Kitchen olmesartan (BENICAR) 20 MG tablet TAKE 1/2 TABLET BY MOUTH DAILY( DISCONTINUE VALSARTAN) 90 tablet 1  . RABEprazole (ACIPHEX) 20 MG tablet Take 20 mg by mouth daily as needed.  1  . traMADol (ULTRAM) 50 MG tablet Take 50 mg by mouth 4 (four) times daily.   0  . triamcinolone cream (KENALOG) 0.1 % Apply 1 application topically 2 (two) times daily. 30 g 0   No current facility-administered medications on file prior to visit.     Review of Systems  Constitutional: Negative for fever.  HENT: Negative for nosebleeds.   Eyes: Negative for blurred vision.  Respiratory: Negative for cough.   Cardiovascular: Positive for chest pain (atypical).  Gastrointestinal: Negative for abdominal pain, nausea and vomiting.  Genitourinary: Negative for dysuria.  Musculoskeletal: Negative for myalgias.  Skin: Negative for itching and rash.  Neurological: Negative for dizziness, seizures and loss of consciousness.  Psychiatric/Behavioral: The patient is not  nervous/anxious.        Objective:  Blood pressure 113/69, height 5\' 4"  (1.626 m), weight 173 lb (78.5 kg). Body mass index is 29.7 kg/m.  Physical Exam  CARDIAC STUDIES:  Echocardiogram 02/22/2016- Left ventricle cavity is normal in size. Normal global wall motion. Normal diastolic filling pattern. Calculated EF 59%. Moderator band in the RV apex (normal variant). Structurally normal mitral valve with trace regurgitation. Mild tricuspid regurgitation. No evidence of pulmonary hypertension. Trace pulmonic regurgitation. Visualized liver appeared to be normal.  Lexiscan myoview stress test 03/17/2017: 1. The resting electrocardiogram demonstrated normal sinus rhythm, normal resting conduction and nonspecific ST-T  changes in the inferior and lateral leads. Stress EKG is non-diagnostic for ischemia as it a pharmacologic stress using Lexiscan. Stress symptoms included nausea and jaw pain. 2. Myocardial perfusion imaging is normal. Overall left ventricular systolic function was normal without regional wall motion abnormalities. The left ventricular ejection fraction was 72%.  Assessment & Recommendations:   1. Essential hypertension   Laboratory Exam:  CBC Latest Ref Rng & Units 10/08/2018 10/01/2018 10/01/2017  WBC 4.0 - 10.5 K/uL 7.9 7.5 7.1  Hemoglobin 12.0 - 15.0 g/dL 13.4 13.0 14.6  Hematocrit 36.0 - 46.0 % 41.4 40.0 45.2  Platelets 150 - 400 K/uL 206 190 195   CMP Latest Ref Rng & Units 10/08/2018 10/01/2018 10/01/2017  Glucose 70 - 99 mg/dL 89 93 124  BUN 8 - 23 mg/dL 14 12 11   Creatinine 0.44 - 1.00 mg/dL 0.84 0.74 0.82  Sodium 135 - 145 mmol/L 138 139 140  Potassium 3.5 - 5.1 mmol/L 4.1 4.3 4.2  Chloride 98 - 111 mmol/L 103 106 105  CO2 22 - 32 mmol/L 26 25 27   Calcium 8.9 - 10.3 mg/dL 9.3 9.2 9.4  Total Protein 6.5 - 8.1 g/dL 7.1 6.7 7.0  Total Bilirubin 0.3 - 1.2 mg/dL 0.6 0.4 0.7  Alkaline Phos 38 - 126 U/L 91 90 85  AST 15 - 41 U/L 22 21 23   ALT 0 - 44 U/L 16 16 18    Lipid  Panel  No results found for: CHOL, TRIG, HDL, CHOLHDL, VLDL, LDLCALC, LDLDIRECT   Recommendation:  Blood pressure is well controlled. Patient has been checking blood pressures regularly at home and it has remained normal. She has complaints of mild diffuse body aches, could be possibly due to side effect of olmesartan. I have advised her to stop olmesartan. Continue metoprolol to 50 mg twice a day. Continue monitoring blood pressure at home and call us if it stays high.  Continue with primary prevention. She was advised to follow low-salt, low-cholesterol diet. She was encouraged to increase regular walking as tolerated.  Return for follow-up after 6 months but call us earlier if the blood pressure is uncontrolled or there are any other cardiac problems.  Despina Hick, MD, [facility] 01/26/2019, 12:23 PM [facility] [place] Pager: [phone] Office: [phone] If no answer Cell [phone]

## 2019-01-25 ENCOUNTER — Encounter: Payer: Self-pay | Admitting: Cardiology

## 2019-01-26 ENCOUNTER — Ambulatory Visit (INDEPENDENT_AMBULATORY_CARE_PROVIDER_SITE_OTHER): Payer: Medicare Other | Admitting: Cardiology

## 2019-01-26 ENCOUNTER — Other Ambulatory Visit: Payer: Self-pay

## 2019-01-26 ENCOUNTER — Encounter: Payer: Self-pay | Admitting: Cardiology

## 2019-01-26 VITALS — BP 113/69 | Ht 64.0 in | Wt 173.0 lb

## 2019-01-26 DIAGNOSIS — I10 Essential (primary) hypertension: Secondary | ICD-10-CM

## 2019-01-26 DIAGNOSIS — R0789 Other chest pain: Secondary | ICD-10-CM

## 2019-01-27 DIAGNOSIS — M25512 Pain in left shoulder: Secondary | ICD-10-CM | POA: Diagnosis not present

## 2019-01-27 DIAGNOSIS — N3941 Urge incontinence: Secondary | ICD-10-CM | POA: Diagnosis not present

## 2019-01-27 DIAGNOSIS — F5101 Primary insomnia: Secondary | ICD-10-CM | POA: Diagnosis not present

## 2019-01-27 DIAGNOSIS — M810 Age-related osteoporosis without current pathological fracture: Secondary | ICD-10-CM | POA: Diagnosis not present

## 2019-01-27 DIAGNOSIS — I10 Essential (primary) hypertension: Secondary | ICD-10-CM | POA: Diagnosis not present

## 2019-02-14 DIAGNOSIS — S99922A Unspecified injury of left foot, initial encounter: Secondary | ICD-10-CM | POA: Diagnosis not present

## 2019-02-18 DIAGNOSIS — M25512 Pain in left shoulder: Secondary | ICD-10-CM | POA: Diagnosis not present

## 2019-02-18 DIAGNOSIS — Z79899 Other long term (current) drug therapy: Secondary | ICD-10-CM | POA: Diagnosis not present

## 2019-02-18 DIAGNOSIS — N390 Urinary tract infection, site not specified: Secondary | ICD-10-CM | POA: Diagnosis not present

## 2019-02-18 DIAGNOSIS — M961 Postlaminectomy syndrome, not elsewhere classified: Secondary | ICD-10-CM | POA: Diagnosis not present

## 2019-02-18 DIAGNOSIS — R350 Frequency of micturition: Secondary | ICD-10-CM | POA: Diagnosis not present

## 2019-02-18 DIAGNOSIS — F411 Generalized anxiety disorder: Secondary | ICD-10-CM | POA: Diagnosis not present

## 2019-02-18 DIAGNOSIS — M47812 Spondylosis without myelopathy or radiculopathy, cervical region: Secondary | ICD-10-CM | POA: Diagnosis not present

## 2019-02-18 DIAGNOSIS — F341 Dysthymic disorder: Secondary | ICD-10-CM | POA: Diagnosis not present

## 2019-03-22 ENCOUNTER — Other Ambulatory Visit: Payer: Self-pay | Admitting: Cardiology

## 2019-03-23 NOTE — Telephone Encounter (Signed)
Please fill

## 2019-04-09 DIAGNOSIS — I10 Essential (primary) hypertension: Secondary | ICD-10-CM | POA: Diagnosis not present

## 2019-04-09 DIAGNOSIS — Z1389 Encounter for screening for other disorder: Secondary | ICD-10-CM | POA: Diagnosis not present

## 2019-04-09 DIAGNOSIS — M810 Age-related osteoporosis without current pathological fracture: Secondary | ICD-10-CM | POA: Diagnosis not present

## 2019-04-09 DIAGNOSIS — Z0000 Encounter for general adult medical examination without abnormal findings: Secondary | ICD-10-CM | POA: Diagnosis not present

## 2019-04-09 DIAGNOSIS — R350 Frequency of micturition: Secondary | ICD-10-CM | POA: Diagnosis not present

## 2019-04-13 DIAGNOSIS — N3000 Acute cystitis without hematuria: Secondary | ICD-10-CM | POA: Diagnosis not present

## 2019-04-13 DIAGNOSIS — R300 Dysuria: Secondary | ICD-10-CM | POA: Diagnosis not present

## 2019-05-20 DIAGNOSIS — F341 Dysthymic disorder: Secondary | ICD-10-CM | POA: Diagnosis not present

## 2019-05-20 DIAGNOSIS — Z79899 Other long term (current) drug therapy: Secondary | ICD-10-CM | POA: Diagnosis not present

## 2019-05-20 DIAGNOSIS — M25512 Pain in left shoulder: Secondary | ICD-10-CM | POA: Diagnosis not present

## 2019-05-20 DIAGNOSIS — F411 Generalized anxiety disorder: Secondary | ICD-10-CM | POA: Diagnosis not present

## 2019-05-20 DIAGNOSIS — M47812 Spondylosis without myelopathy or radiculopathy, cervical region: Secondary | ICD-10-CM | POA: Diagnosis not present

## 2019-06-03 ENCOUNTER — Inpatient Hospital Stay: Payer: Medicare Other | Attending: Hematology

## 2019-06-03 ENCOUNTER — Other Ambulatory Visit: Payer: Self-pay

## 2019-06-03 DIAGNOSIS — M549 Dorsalgia, unspecified: Secondary | ICD-10-CM | POA: Diagnosis not present

## 2019-06-03 DIAGNOSIS — R358 Other polyuria: Secondary | ICD-10-CM | POA: Insufficient documentation

## 2019-06-03 DIAGNOSIS — C7A019 Malignant carcinoid tumor of the small intestine, unspecified portion: Secondary | ICD-10-CM

## 2019-06-03 DIAGNOSIS — I10 Essential (primary) hypertension: Secondary | ICD-10-CM | POA: Insufficient documentation

## 2019-06-03 DIAGNOSIS — Z79899 Other long term (current) drug therapy: Secondary | ICD-10-CM | POA: Diagnosis not present

## 2019-06-03 DIAGNOSIS — R21 Rash and other nonspecific skin eruption: Secondary | ICD-10-CM | POA: Diagnosis not present

## 2019-06-03 LAB — COMPREHENSIVE METABOLIC PANEL
ALT: 19 U/L (ref 0–44)
AST: 27 U/L (ref 15–41)
Albumin: 4.3 g/dL (ref 3.5–5.0)
Alkaline Phosphatase: 102 U/L (ref 38–126)
Anion gap: 10 (ref 5–15)
BUN: 28 mg/dL — ABNORMAL HIGH (ref 8–23)
CO2: 27 mmol/L (ref 22–32)
Calcium: 9.7 mg/dL (ref 8.9–10.3)
Chloride: 105 mmol/L (ref 98–111)
Creatinine, Ser: 1.01 mg/dL — ABNORMAL HIGH (ref 0.44–1.00)
GFR calc Af Amer: >60 mL/min (ref >60)
GFR calc non Af Amer: 54 mL/min — ABNORMAL LOW (ref >60)
Glucose, Bld: 104 mg/dL — ABNORMAL HIGH (ref 70–99)
Potassium: 4.6 mmol/L (ref 3.5–5.1)
Sodium: 142 mmol/L (ref 135–145)
Total Bilirubin: 0.5 mg/dL (ref 0.3–1.2)
Total Protein: 7.4 g/dL (ref 6.5–8.1)

## 2019-06-03 LAB — CBC WITH DIFFERENTIAL/PLATELET
Abs Immature Granulocytes: 0.04 10*3/uL (ref 0.00–0.07)
Basophils Absolute: 0 10*3/uL (ref 0.0–0.1)
Basophils Relative: 0 %
Eosinophils Absolute: 0.2 10*3/uL (ref 0.0–0.5)
Eosinophils Relative: 2 %
HCT: 45.6 % (ref 36.0–46.0)
Hemoglobin: 14.6 g/dL (ref 12.0–15.0)
Immature Granulocytes: 0 %
Lymphocytes Relative: 24 %
Lymphs Abs: 2.3 10*3/uL (ref 0.7–4.0)
MCH: 29.6 pg (ref 26.0–34.0)
MCHC: 32 g/dL (ref 30.0–36.0)
MCV: 92.5 fL (ref 80.0–100.0)
Monocytes Absolute: 0.8 10*3/uL (ref 0.1–1.0)
Monocytes Relative: 9 %
Neutro Abs: 6.4 10*3/uL (ref 1.7–7.7)
Neutrophils Relative %: 65 %
Platelets: 234 10*3/uL (ref 150–400)
RBC: 4.93 MIL/uL (ref 3.87–5.11)
RDW: 13.6 % (ref 11.5–15.5)
WBC: 9.8 10*3/uL (ref 4.0–10.5)
nRBC: 0 % (ref 0.0–0.2)

## 2019-06-04 LAB — CHROMOGRANIN A: Chromogranin A (ng/mL): 65.2 ng/mL (ref 0.0–101.8)

## 2019-06-07 NOTE — Progress Notes (Signed)
[place]   [phone] [phone]   Clinic Follow up Note   Patient Care Team: Dianna Rossetti, NP (Inactive) as PCP - General (Nurse Practitioner)  Date of Service:  06/10/2019  CHIEF COMPLAINT: F/u on malignant carcinoid tumor of the small bowel  SUMMARY OF ONCOLOGIC HISTORY: Oncology History Overview Note  Presented w/flushing abdominal pain   Malignant carcinoid tumor of small intestine ([place])  12/23/2007 Initial Diagnosis   Malignant carcinoid tumor of small intestine ([place])   12/23/2007 Definitive Surgery   Resection of primary tumor in small intestine and lobectomy at [facility] 2009--Stage IV   10/03/2015 Tumor Marker   Chromogranin A=2.0   11/15/2015 Imaging   CT ABDOMEN/PELVIS: Diffuse hepatic steatosis, no focal lesions; high-grade SBO likely due to adhesions; mild mesenteric edema and trace ascites ([place])   10/22/2016 Imaging   CT Abdomen pelvis W Contrast  IMPRESSION: 1. No acute findings and no specific features identified to suggest recurrent tumor or metastatic disease. 2. Status post partial right hepatectomy. 3. Aortic atherosclerosis.   08/20/2017 Imaging   CT Abdomen pelvis WO Contrast  IMPRESSION: No acute findings within the abdomen. Stable postsurgical changes of right hepatectomy and distal small bowel resection. Prominence of bilateral adnexal regions, with uncertain significance.       CURRENT THERAPY:  Surveillance  INTERVAL HISTORY:  Emma Buchanan is here for a follow up. She was last seen by me 8 months ago. She presents to the clinic alone. She notes she is doing well. She notes she still has arthritis pain and has skin rash on her face and Lower legs and arms. She notes she does scratch her. She notes having prolonged UTI for weeks. She notes feeling lower abdominal discomfort and polyuria.    REVIEW OF SYSTEMS:   Constitutional: Denies fevers, chills or abnormal  weight loss Eyes: Denies blurriness of vision Ears, nose, mouth, throat, and face: Denies mucositis or sore throat Respiratory: Denies cough, dyspnea or wheezes Cardiovascular: Denies palpitation, chest discomfort or lower extremity swelling Gastrointestinal:  Denies nausea, heartburn or change in bowel habits Skin: (+) Skin rash/lesion of face, arms and legs.  Lymphatics: Denies new lymphadenopathy or easy bruising Neurological:Denies numbness, tingling or new weaknesses Behavioral/Psych: Mood is stable, no new changes  All other systems were reviewed with the patient and are negative.  MEDICAL HISTORY:  Past Medical History:  Diagnosis Date  . Cancer ([place])   . H/O resection of liver   . H/O shoulder surgery   . Hypertension   . Myocardial infarction [facility])     SURGICAL HISTORY: Past Surgical History:  Procedure Laterality Date  . CERVICAL SPINE SURGERY    . CHOLECYSTECTOMY    . LIVER RESECTION    . SHOULDER SURGERY    . SMALL INTESTINE SURGERY      I have reviewed the social history and family history with the patient and they are unchanged from previous note.  ALLERGIES:  is allergic to duloxetine; meperidine; and nubain [nalbuphine hcl].  MEDICATIONS:  Current Outpatient Medications  Medication Sig Dispense Refill  . Acetaminophen (TYLENOL 8 HOUR ARTHRITIS PAIN PO) Take 1 tablet by mouth as needed.    Marland Kitchen aspirin 81 MG tablet Take 81 mg by mouth daily.    . Cholecalciferol (HM VITAMIN D3) 2000 units CAPS Take 1 capsule by mouth daily.    Marland Kitchen DAILY MULTIPLE VITAMINS tablet Take 1 tablet by mouth daily.    Marland Kitchen LORazepam (ATIVAN) 0.5  MG tablet Take 0.5 mg by mouth at bedtime as needed.    . metoprolol tartrate (LOPRESSOR) 50 MG tablet TAKE 1 TABLET BY MOUTH TWICE DAILY 180 tablet 3  . RABEprazole (ACIPHEX) 20 MG tablet Take 20 mg by mouth daily as needed.  1  . traMADol (ULTRAM) 50 MG tablet Take 50 mg by mouth 4 (four) times daily.   0  . triamcinolone cream (KENALOG) 0.1 %  Apply 1 application topically 2 (two) times daily. 30 g 0   No current facility-administered medications for this visit.     PHYSICAL EXAMINATION: ECOG PERFORMANCE STATUS: 0 - Asymptomatic  Vitals:   06/10/19 1420  BP: (!) 158/88  Pulse: 93  Resp: 18  Temp: 98.3 F (36.8 C)  SpO2: 97%   Filed Weights   06/10/19 1420  Weight: 180 lb 9.6 oz (81.9 kg)    GENERAL:alert, no distress and comfortable SKIN: skin color, texture, turgor are normal (+) Skin rash or face, lower legs and arms.  EYES: normal, Conjunctiva are pink and non-injected, sclera clear  NECK: supple, thyroid normal size, non-tender, without nodularity LYMPH:  no palpable lymphadenopathy in the cervical, axillary  LUNGS: clear to auscultation and percussion with normal breathing effort HEART: regular rate & rhythm and no murmurs and no lower extremity edema ABDOMEN:abdomen soft, non-tender and normal bowel sounds Musculoskeletal:no cyanosis of digits and no clubbing  NEURO: alert & oriented x 3 with fluent speech, no focal motor/sensory deficits  LABORATORY DATA:  I have reviewed the data as listed CBC Latest Ref Rng & Units 06/03/2019 10/08/2018 10/01/2018  WBC 4.0 - 10.5 K/uL 9.8 7.9 7.5  Hemoglobin 12.0 - 15.0 g/dL 14.6 13.4 13.0  Hematocrit 36.0 - 46.0 % 45.6 41.4 40.0  Platelets 150 - 400 K/uL 234 206 190     CMP Latest Ref Rng & Units 06/03/2019 10/08/2018 10/01/2018  Glucose 70 - 99 mg/dL 104(H) 89 93  BUN 8 - 23 mg/dL 28(H) 14 12  Creatinine 0.44 - 1.00 mg/dL 1.01(H) 0.84 0.74  Sodium 135 - 145 mmol/L 142 138 139  Potassium 3.5 - 5.1 mmol/L 4.6 4.1 4.3  Chloride 98 - 111 mmol/L 105 103 106  CO2 22 - 32 mmol/L 27 26 25   Calcium 8.9 - 10.3 mg/dL 9.7 9.3 9.2  Total Protein 6.5 - 8.1 g/dL 7.4 7.1 6.7  Total Bilirubin 0.3 - 1.2 mg/dL 0.5 0.6 0.4  Alkaline Phos 38 - 126 U/L 102 91 90  AST 15 - 41 U/L 27 22 21   ALT 0 - 44 U/L 19 16 16       RADIOGRAPHIC STUDIES: I have personally reviewed the  radiological images as listed and agreed with the findings in the report. No results found.   ASSESSMENT & PLAN:  Emma Buchanan is a 78 y.o. 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 Treated with surgery. Currently on surveillance. NED -She is clinically doing well. Lab reviewed from this week, her CBC and CMP are within normal limits except BG 104, BUN 28, Cr 1.01. Chromogranin A normal. Her physical exam unremarkable except moderate skin rash. There is no clinical concern for recurrence. -I discussed she is more than 10 years since diagnosis. Her risk of recurrence is minimal at this point.  -She can conitnue surveillance with PCP. However she does not currently have one. I highly recommend she find PCP to manage her overall health and conitnue her tumor surveillance. She  is agreeable.  -She can see me as needed in the future -She has a flu shot in 05/2019  2. HTN -Managed by cardiologist  -on aspirin, lopressor -158/88 today (06/10/19).   3. Arthritis with Hip and Back pain  -She had right hip and back surgeries in the past -She takes Tramadol BID and tylenol as needed -I asked her to talk to her talk to her orthopedic surgeon to see if she needs a image study   -stable and manageable.   4. Cancer screenings, osteoporosis -Her 08/2018 mammogram was normal. -Her last colonoscopy was several years ago, before moving to [place]. She is 18 and another is likely not recommenced.  -Her 07/2018 DEXA shows osteoporosis (T-score -3.9).   5. Skin rash/lesions -On face, lower legs and arms -She notes she does scratch these which can make it worse. -I strongly encouraged her to follow up with a dermatologist for further evaluation  6. Polyuria, Pelvic Discomfort  -She notes this has been going on for weeks including posterior lower lateral back discomfort.  -I will obtain UA and culture today to rule out  UTI. She is agreeable. If sign of infection I will call in antibiotics. She has diarrhea with Keflex in the past.  -If this is not infection, I recommend she see f/u with PCP or see Urologist.   Plan  -UA today  -She is clinically stable. Continue surveillance -f/u as needed in the future and find PCP   No problem-specific Assessment & Plan notes found for this encounter.   Orders Placed This Encounter  Procedures  . Urine Culture    Standing Status:   Future    Number of Occurrences:   1    Standing Expiration Date:   06/09/2020  . Urinalysis, Complete w Microscopic    Standing Status:   Future    Number of Occurrences:   1    Standing Expiration Date:   06/09/2020   All questions were answered. The patient knows to call the clinic with any problems, questions or concerns. No barriers to learning was detected. I spent 15 minutes counseling the patient face to face. The total time spent in the appointment was 20 minutes and more than 50% was on counseling and review of test results     Truitt Merle, MD 06/10/2019   I, Joslyn Devon, am acting as scribe for Truitt Merle, MD.   I have reviewed the above documentation for accuracy and completeness, and I agree with the above.

## 2019-06-10 ENCOUNTER — Inpatient Hospital Stay: Payer: Medicare Other

## 2019-06-10 ENCOUNTER — Telehealth: Payer: Self-pay | Admitting: Hematology

## 2019-06-10 ENCOUNTER — Other Ambulatory Visit: Payer: Self-pay

## 2019-06-10 ENCOUNTER — Inpatient Hospital Stay (HOSPITAL_BASED_OUTPATIENT_CLINIC_OR_DEPARTMENT_OTHER): Payer: Medicare Other | Admitting: Hematology

## 2019-06-10 ENCOUNTER — Encounter: Payer: Self-pay | Admitting: Hematology

## 2019-06-10 VITALS — BP 158/88 | HR 93 | Temp 98.3°F | Resp 18 | Ht 64.0 in | Wt 180.6 lb

## 2019-06-10 DIAGNOSIS — N3000 Acute cystitis without hematuria: Secondary | ICD-10-CM

## 2019-06-10 DIAGNOSIS — I10 Essential (primary) hypertension: Secondary | ICD-10-CM | POA: Diagnosis not present

## 2019-06-10 DIAGNOSIS — C7A019 Malignant carcinoid tumor of the small intestine, unspecified portion: Secondary | ICD-10-CM | POA: Diagnosis not present

## 2019-06-10 DIAGNOSIS — R21 Rash and other nonspecific skin eruption: Secondary | ICD-10-CM | POA: Diagnosis not present

## 2019-06-10 DIAGNOSIS — R358 Other polyuria: Secondary | ICD-10-CM | POA: Diagnosis not present

## 2019-06-10 DIAGNOSIS — M549 Dorsalgia, unspecified: Secondary | ICD-10-CM | POA: Diagnosis not present

## 2019-06-10 DIAGNOSIS — Z79899 Other long term (current) drug therapy: Secondary | ICD-10-CM | POA: Diagnosis not present

## 2019-06-10 LAB — URINALYSIS, COMPLETE (UACMP) WITH MICROSCOPIC
Bilirubin Urine: NEGATIVE
Glucose, UA: NEGATIVE mg/dL
Hgb urine dipstick: NEGATIVE
Ketones, ur: NEGATIVE mg/dL
Nitrite: NEGATIVE
Protein, ur: NEGATIVE mg/dL
Specific Gravity, Urine: 1.014 (ref 1.005–1.030)
WBC, UA: >50 WBC/hpf — ABNORMAL HIGH (ref 0–5)
pH: 5 (ref 5.0–8.0)

## 2019-06-10 MED ORDER — CIPROFLOXACIN HCL 500 MG PO TABS: 500.0000 mg | ORAL_TABLET | Freq: Two times a day (BID) | ORAL | 0 refills | Status: DC

## 2019-06-10 NOTE — Telephone Encounter (Signed)
Per 9/17 los F/u as needed

## 2019-06-11 ENCOUNTER — Telehealth: Payer: Self-pay

## 2019-06-11 NOTE — Telephone Encounter (Signed)
-----   Message from Truitt Merle, MD sent at 06/10/2019 11:10 PM EDT ----- Please let pt know her UA was positive today, and I called in cipro 500mg  bid for 5 days for her. I will f/u urine culture result. Thanks   Truitt Merle  06/10/2019

## 2019-06-11 NOTE — Telephone Encounter (Signed)
Spoke with patient regarding urinalysis results, per Dr. Burr Medico it does show infection, she has sent in Cipro 500 mg take one twice daily for 5 days, informed her if culture comes back that this is not the right antibiotic for her type of infection we will call, finish all of her antibiotic,drink plenty of fluids.  The patient verbalized an understanding.

## 2019-06-12 ENCOUNTER — Encounter: Payer: Self-pay | Admitting: Hematology

## 2019-06-12 LAB — URINE CULTURE: Culture: 20000 — AB

## 2019-06-17 ENCOUNTER — Encounter: Payer: Self-pay | Admitting: Hematology

## 2019-07-29 DIAGNOSIS — R300 Dysuria: Secondary | ICD-10-CM | POA: Diagnosis not present

## 2019-07-29 DIAGNOSIS — R21 Rash and other nonspecific skin eruption: Secondary | ICD-10-CM | POA: Diagnosis not present

## 2019-08-02 ENCOUNTER — Ambulatory Visit: Payer: Medicare Other | Admitting: Cardiology

## 2019-08-16 DIAGNOSIS — Z79899 Other long term (current) drug therapy: Secondary | ICD-10-CM | POA: Diagnosis not present

## 2019-08-16 DIAGNOSIS — F341 Dysthymic disorder: Secondary | ICD-10-CM | POA: Diagnosis not present

## 2019-08-16 DIAGNOSIS — M961 Postlaminectomy syndrome, not elsewhere classified: Secondary | ICD-10-CM | POA: Diagnosis not present

## 2019-08-16 DIAGNOSIS — M545 Low back pain: Secondary | ICD-10-CM | POA: Diagnosis not present

## 2019-08-16 DIAGNOSIS — Z6831 Body mass index (BMI) 31.0-31.9, adult: Secondary | ICD-10-CM | POA: Diagnosis not present

## 2019-08-16 DIAGNOSIS — R030 Elevated blood-pressure reading, without diagnosis of hypertension: Secondary | ICD-10-CM | POA: Diagnosis not present

## 2019-08-16 DIAGNOSIS — F411 Generalized anxiety disorder: Secondary | ICD-10-CM | POA: Diagnosis not present

## 2019-08-16 DIAGNOSIS — L281 Prurigo nodularis: Secondary | ICD-10-CM | POA: Diagnosis not present

## 2019-08-18 ENCOUNTER — Ambulatory Visit: Payer: Medicare Other | Admitting: Cardiology

## 2019-09-03 ENCOUNTER — Telehealth: Payer: Self-pay

## 2019-09-03 NOTE — Telephone Encounter (Signed)
Pt called states the the metoprolol is not working her bp is high all the time, systolic [id number] or higher Pt wants to go back on Valsartan; Please advise

## 2019-09-06 NOTE — Telephone Encounter (Signed)
Try both and see how she does.

## 2019-09-08 ENCOUNTER — Other Ambulatory Visit: Payer: Self-pay

## 2019-09-08 DIAGNOSIS — I10 Essential (primary) hypertension: Secondary | ICD-10-CM

## 2019-09-08 MED ORDER — VALSARTAN 80 MG PO TABS: 80.0000 mg | ORAL_TABLET | Freq: Every day | ORAL | 3 refills | Status: DC

## 2019-09-08 NOTE — Telephone Encounter (Signed)
Pt aware and medication has been sent in to her pharmacy

## 2019-09-22 ENCOUNTER — Encounter: Payer: Self-pay | Admitting: Cardiology

## 2019-09-22 ENCOUNTER — Other Ambulatory Visit: Payer: Self-pay

## 2019-09-22 ENCOUNTER — Ambulatory Visit (INDEPENDENT_AMBULATORY_CARE_PROVIDER_SITE_OTHER): Payer: Medicare Other | Admitting: Cardiology

## 2019-09-22 VITALS — BP 128/78 | HR 101 | Temp 98.0°F | Ht 63.0 in | Wt 179.0 lb

## 2019-09-22 DIAGNOSIS — I10 Essential (primary) hypertension: Secondary | ICD-10-CM

## 2019-09-22 NOTE — Progress Notes (Signed)
Primary Physician/Referring:  Leeroy Cha, MD  Patient ID: Emma Buchanan, female    DOB: Feb 06, 1941, 78 y.o.   MRN: [id number]  Chief Complaint  Patient presents with  . Hypertension  . Chest Pain  . Follow-up    24mo   HPI:    Emma Buchanan  is a 78 y.o. 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  She had called our office stating that metoprolol was not working and she wanted to go back to being on valsartan, advised her to continue both.   She has no history of hyperlipidemia or diabetes mellitus or tobacco use. That since that her blood pressure controlled, she remains asymptomatic.  Past Medical History:  Diagnosis Date  . Cancer ([place])   . H/O resection of liver   . H/O shoulder surgery   . Hypertension   . Myocardial infarction [facility])    Past Surgical History:  Procedure Laterality Date  . CERVICAL SPINE SURGERY    . CHOLECYSTECTOMY    . LIVER RESECTION    . SHOULDER SURGERY    . SMALL INTESTINE SURGERY     Social History   Socioeconomic History  . Marital status: Widowed    Spouse name: Not on file  . Number of children: 3  . Years of education: Not on file  . Highest education level: Not on file  Occupational History  . Not on file  Tobacco Use  . Smoking status: Never Smoker  . Smokeless tobacco: Never Used  Substance and Sexual Activity  . Alcohol use: No  . Drug use: No  . Sexual activity: Not on file  Other Topics Concern  . Not on file  Social History Narrative  . Not on file   Social Determinants of Health   Financial Resource Strain:   . Difficulty of Paying Living Expenses: Not on file  Food Insecurity:   . Worried About Charity fundraiser in the Last  Year: Not on file  . Ran Out of Food in the Last Year: Not on file  Transportation Needs:   . Lack of Transportation (Medical): Not on file  . Lack of Transportation (Non-Medical): Not on file  Physical Activity:   . Days of Exercise per Week: Not on file  . Minutes of Exercise per Session: Not on file  Stress:   . Feeling of Stress : Not on file  Social Connections:   . Frequency of Communication with Friends and Family: Not on file  . Frequency of Social Gatherings with Friends and Family: Not on file  . Attends Religious Services: Not on file  . Active Member of Clubs or Organizations: Not on file  . Attends Archivist Meetings: Not on file  . Marital Status: Not on file  Intimate Partner Violence:   . Fear of Current or Ex-Partner: Not on file  . Emotionally Abused: Not on file  . Physically Abused: Not on file  . Sexually Abused: Not on file   ROS  Review of Systems  Constitution: Negative for chills, decreased appetite, malaise/fatigue and weight gain.  Cardiovascular: Negative for dyspnea on exertion, leg swelling and syncope.  Endocrine: Negative for cold intolerance.  Hematologic/Lymphatic: Does not bruise/bleed easily.  Musculoskeletal: Positive for arthritis. Negative for  joint swelling.  Gastrointestinal: Negative for abdominal pain, anorexia, change in bowel habit, hematochezia and melena.  Neurological: Negative for headaches and light-headedness.  Psychiatric/Behavioral: Negative for depression and substance abuse.  All other systems reviewed and are negative.  Objective  Blood pressure 128/78, pulse (!) 101, temperature 98 F (36.7 C), height 5\' 3"  (1.6 m), weight 179 lb (81.2 kg), SpO2 95 %.  Vitals with BMI 09/22/2019 06/10/2019 01/26/2019  Height 5\' 3"  5\' 4"  5\' 4"   Weight 179 lbs 180 lbs 10 oz 173 lbs  BMI 31.72 [id number]  Systolic 0000000 0000000 123456  Diastolic 78 88 69  Pulse [id number] 93 -     Physical Exam  Constitutional:  She is moderately built and  mildly obese in no acute distress.  HENT:  Head: Atraumatic.  Eyes: Conjunctivae are normal.  Neck: No JVD present. No thyromegaly present.  Cardiovascular: Normal rate, regular rhythm, normal heart sounds and intact distal pulses. Exam reveals no gallop.  No murmur heard. No leg edema, no JVD.  Pulmonary/Chest: Effort normal and breath sounds normal.  Abdominal: Soft. Bowel sounds are normal.  Musculoskeletal:        General: Normal range of motion.     Cervical back: Neck supple.  Neurological: She is alert.  Skin: Skin is warm and dry.  Psychiatric: She has a normal mood and affect.   Laboratory examination:   Recent Labs    10/01/18 1159 10/08/18 1205 06/03/19 1352  NA 139 138 142  K 4.3 4.1 4.6  CL 106 103 105  CO2 25 26 27   GLUCOSE 93 89 104*  BUN 12 14 28*  CREATININE 0.74 0.84 1.01*  CALCIUM 9.2 9.3 9.7  GFRNONAA >60 >60 54*  GFRAA >60 >60 >60   CrCl cannot be calculated (Patient's most recent lab result is older than the maximum 21 days allowed.).  CMP Latest Ref Rng & Units 06/03/2019 10/08/2018 10/01/2018  Glucose 70 - 99 mg/dL 104(H) 89 93  BUN 8 - 23 mg/dL 28(H) 14 12  Creatinine 0.44 - 1.00 mg/dL 1.01(H) 0.84 0.74  Sodium 135 - 145 mmol/L 142 138 139  Potassium 3.5 - 5.1 mmol/L 4.6 4.1 4.3  Chloride 98 - 111 mmol/L 105 103 106  CO2 22 - 32 mmol/L 27 26 25   Calcium 8.9 - 10.3 mg/dL 9.7 9.3 9.2  Total Protein 6.5 - 8.1 g/dL 7.4 7.1 6.7  Total Bilirubin 0.3 - 1.2 mg/dL 0.5 0.6 0.4  Alkaline Phos 38 - 126 U/L 102 91 90  AST 15 - 41 U/L 27 22 21   ALT 0 - 44 U/L 19 16 16    CBC Latest Ref Rng & Units 06/03/2019 10/08/2018 10/01/2018  WBC 4.0 - 10.5 K/uL 9.8 7.9 7.5  Hemoglobin 12.0 - 15.0 g/dL 14.6 13.4 13.0  Hematocrit 36.0 - 46.0 % 45.6 41.4 40.0  Platelets 150 - 400 K/uL 234 206 190   Lipid Panel  No results found for: CHOL, TRIG, HDL, CHOLHDL, VLDL, LDLCALC, LDLDIRECT HEMOGLOBIN A1C No results found for: HGBA1C, MPG TSH No results for input(s): TSH in  the last 8760 hours.   Medications and allergies   Allergies  Allergen Reactions  . Duloxetine Nausea And Vomiting    Cause severe pain  . Meperidine Nausea And Vomiting  . Nubain [Nalbuphine Hcl]      Current Outpatient Medications  Medication Instructions  . Acetaminophen (TYLENOL 8 HOUR ARTHRITIS PAIN PO) 1 tablet, Oral, As needed  . aspirin 81 mg, Oral, Daily  .  Cholecalciferol (HM VITAMIN D3) 2000 units CAPS 1 capsule, Oral, Daily  . DAILY MULTIPLE VITAMINS tablet 1 tablet, Oral, Daily, Centrum silver  . lidocaine (LIDODERM) 5 % 1 patch, Daily  . LORazepam (ATIVAN) 0.5 mg, Oral, At bedtime PRN  . metoprolol tartrate (LOPRESSOR) 50 MG tablet TAKE 1 TABLET BY MOUTH TWICE DAILY  . mupirocin ointment (BACTROBAN) 2 % As needed  . RABEprazole (ACIPHEX) 20 mg, Oral, Daily PRN  . traMADol (ULTRAM) 50 mg, Oral, 2 times daily  . triamcinolone cream (KENALOG) 0.1 % 1 application, Topical, 2 times daily  . valsartan (DIOVAN) 80 mg, Oral, Daily    Radiology:  No results found.  Cardiac Studies:   Echocardiogram 02/22/2016- Left ventricle cavity is normal in size. Normal global wall motion. Normal diastolic filling pattern. Calculated EF 59%. Moderator band in the RV apex (normal variant). Structurally normal mitral valve with trace regurgitation. Mild tricuspid regurgitation. No evidence of pulmonary hypertension. Trace pulmonic regurgitation. Visualized liver appeared to be normal.  Lexiscan myoview stress test 03/17/2017: 1. The resting electrocardiogram demonstrated normal sinus rhythm, normal resting conduction and nonspecific ST-T changes in the inferior and lateral leads. Stress EKG is non-diagnostic for ischemia as it a pharmacologic stress using Lexiscan. Stress symptoms included nausea and jaw pain. 2. Myocardial perfusion imaging is normal. Overall left ventricular systolic function was normal without regional wall motion abnormalities. The left ventricular ejection fraction  was 72%.  Assessment     ICD-10-CM   1. Essential hypertension  I10 EKG 12-Lead    EKG 09/22/2019: Normal sinus rhythm at the rate of 92 beats minute, normal axis, poor R-wave progression, cannot extend anteroseptal infarct old.  No evidence of ischemia.  Low-voltage in the chest leads.   Recommendations:  No orders of the defined types were placed in this encounter.   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   She is presently doing well and essentially remains asymptomatic and is tolerating combination of metoprolol and also valsartan with excellent control of blood pressure.  I'll see her back on a p.r.n. basis.  Adrian Prows, MD, [facility] 09/22/2019, 3:31 PM [facility] Pager: [phone] Office: [phone]

## 2019-10-13 ENCOUNTER — Ambulatory Visit
Admission: RE | Admit: 2019-10-13 | Discharge: 2019-10-13 | Disposition: A | Discharge status: Home / Self Care | Payer: Medicare Other | Source: Ambulatory Visit | Attending: Internal Medicine | Admitting: Internal Medicine

## 2019-10-13 ENCOUNTER — Other Ambulatory Visit: Payer: Self-pay | Admitting: Internal Medicine

## 2019-10-13 DIAGNOSIS — M542 Cervicalgia: Secondary | ICD-10-CM | POA: Diagnosis not present

## 2019-10-13 DIAGNOSIS — I10 Essential (primary) hypertension: Secondary | ICD-10-CM | POA: Diagnosis not present

## 2019-10-13 DIAGNOSIS — M5410 Radiculopathy, site unspecified: Secondary | ICD-10-CM | POA: Diagnosis not present

## 2019-10-20 ENCOUNTER — Ambulatory Visit: Payer: Medicare Other

## 2019-10-29 ENCOUNTER — Ambulatory Visit: Payer: Medicare Other

## 2019-11-03 ENCOUNTER — Telehealth: Payer: Self-pay

## 2019-11-03 NOTE — Telephone Encounter (Signed)
Returned call to pt. There is a EKG in her chart for that day. Pt is aware but states that she remembers Lavone Nian mentioning it but does not recall having it done.//ah

## 2019-12-07 DIAGNOSIS — Z79899 Other long term (current) drug therapy: Secondary | ICD-10-CM | POA: Diagnosis not present

## 2019-12-07 DIAGNOSIS — F341 Dysthymic disorder: Secondary | ICD-10-CM | POA: Diagnosis not present

## 2019-12-07 DIAGNOSIS — M47812 Spondylosis without myelopathy or radiculopathy, cervical region: Secondary | ICD-10-CM | POA: Diagnosis not present

## 2019-12-07 DIAGNOSIS — M961 Postlaminectomy syndrome, not elsewhere classified: Secondary | ICD-10-CM | POA: Diagnosis not present

## 2019-12-07 DIAGNOSIS — R030 Elevated blood-pressure reading, without diagnosis of hypertension: Secondary | ICD-10-CM | POA: Diagnosis not present

## 2019-12-07 DIAGNOSIS — M545 Low back pain: Secondary | ICD-10-CM | POA: Diagnosis not present

## 2019-12-07 DIAGNOSIS — F411 Generalized anxiety disorder: Secondary | ICD-10-CM | POA: Diagnosis not present

## 2020-01-12 ENCOUNTER — Telehealth: Payer: Self-pay

## 2020-01-12 DIAGNOSIS — R350 Frequency of micturition: Secondary | ICD-10-CM | POA: Diagnosis not present

## 2020-01-12 DIAGNOSIS — I10 Essential (primary) hypertension: Secondary | ICD-10-CM | POA: Diagnosis not present

## 2020-01-12 DIAGNOSIS — L209 Atopic dermatitis, unspecified: Secondary | ICD-10-CM | POA: Diagnosis not present

## 2020-01-12 NOTE — Telephone Encounter (Signed)
Pt is requesting a call back to confirm medication instruction for two meds. Call her at [phone]

## 2020-01-13 NOTE — Telephone Encounter (Signed)
Relayed information to patient. Patient stated another staff member had given pt a call. Patient has no further questions at the time.

## 2020-01-13 NOTE — Telephone Encounter (Signed)
Patient had not been taking her Valsartan. She thought Dr. Einar Gip did not want her to take it. I instructed her to take Valsartan along with metoprolol. Per Dr. Einar Gip note in December. Instructed patient to call back if she experiences any issues with Blood pressure.

## 2020-01-25 ENCOUNTER — Encounter: Payer: Self-pay | Admitting: Hematology

## 2020-03-07 DIAGNOSIS — M961 Postlaminectomy syndrome, not elsewhere classified: Secondary | ICD-10-CM | POA: Diagnosis not present

## 2020-03-07 DIAGNOSIS — M25512 Pain in left shoulder: Secondary | ICD-10-CM | POA: Diagnosis not present

## 2020-03-07 DIAGNOSIS — M545 Low back pain: Secondary | ICD-10-CM | POA: Diagnosis not present

## 2020-03-07 DIAGNOSIS — M47812 Spondylosis without myelopathy or radiculopathy, cervical region: Secondary | ICD-10-CM | POA: Diagnosis not present

## 2020-03-07 DIAGNOSIS — Z79899 Other long term (current) drug therapy: Secondary | ICD-10-CM | POA: Diagnosis not present

## 2020-03-16 ENCOUNTER — Telehealth: Payer: Self-pay

## 2020-03-16 NOTE — Telephone Encounter (Signed)
Confirmed medication with patient.

## 2020-04-10 ENCOUNTER — Other Ambulatory Visit: Payer: Self-pay | Admitting: Cardiology

## 2020-04-11 DIAGNOSIS — Z9189 Other specified personal risk factors, not elsewhere classified: Secondary | ICD-10-CM | POA: Diagnosis not present

## 2020-04-11 DIAGNOSIS — Z85060 Personal history of malignant carcinoid tumor of small intestine: Secondary | ICD-10-CM | POA: Diagnosis not present

## 2020-04-11 DIAGNOSIS — Z8619 Personal history of other infectious and parasitic diseases: Secondary | ICD-10-CM | POA: Diagnosis not present

## 2020-04-11 DIAGNOSIS — I10 Essential (primary) hypertension: Secondary | ICD-10-CM | POA: Diagnosis not present

## 2020-04-11 DIAGNOSIS — K219 Gastro-esophageal reflux disease without esophagitis: Secondary | ICD-10-CM | POA: Diagnosis not present

## 2020-04-11 DIAGNOSIS — Z1159 Encounter for screening for other viral diseases: Secondary | ICD-10-CM | POA: Diagnosis not present

## 2020-04-11 DIAGNOSIS — M810 Age-related osteoporosis without current pathological fracture: Secondary | ICD-10-CM | POA: Diagnosis not present

## 2020-04-11 DIAGNOSIS — F5101 Primary insomnia: Secondary | ICD-10-CM | POA: Diagnosis not present

## 2020-04-12 ENCOUNTER — Other Ambulatory Visit: Payer: Self-pay | Admitting: Internal Medicine

## 2020-04-12 DIAGNOSIS — M810 Age-related osteoporosis without current pathological fracture: Secondary | ICD-10-CM

## 2020-04-12 DIAGNOSIS — Z1231 Encounter for screening mammogram for malignant neoplasm of breast: Secondary | ICD-10-CM

## 2020-06-05 DIAGNOSIS — F411 Generalized anxiety disorder: Secondary | ICD-10-CM | POA: Diagnosis not present

## 2020-06-05 DIAGNOSIS — M25512 Pain in left shoulder: Secondary | ICD-10-CM | POA: Diagnosis not present

## 2020-06-05 DIAGNOSIS — F341 Dysthymic disorder: Secondary | ICD-10-CM | POA: Diagnosis not present

## 2020-06-05 DIAGNOSIS — Z6831 Body mass index (BMI) 31.0-31.9, adult: Secondary | ICD-10-CM | POA: Diagnosis not present

## 2020-06-05 DIAGNOSIS — M47812 Spondylosis without myelopathy or radiculopathy, cervical region: Secondary | ICD-10-CM | POA: Diagnosis not present

## 2020-06-05 DIAGNOSIS — M545 Low back pain: Secondary | ICD-10-CM | POA: Diagnosis not present

## 2020-06-05 DIAGNOSIS — R030 Elevated blood-pressure reading, without diagnosis of hypertension: Secondary | ICD-10-CM | POA: Diagnosis not present

## 2020-06-05 DIAGNOSIS — M961 Postlaminectomy syndrome, not elsewhere classified: Secondary | ICD-10-CM | POA: Diagnosis not present

## 2020-07-05 ENCOUNTER — Ambulatory Visit
Admission: RE | Admit: 2020-07-05 | Discharge: 2020-07-05 | Disposition: A | Discharge status: Home / Self Care | Payer: Medicare Other | Source: Ambulatory Visit | Attending: Internal Medicine | Admitting: Internal Medicine

## 2020-07-05 ENCOUNTER — Other Ambulatory Visit: Payer: Self-pay | Admitting: Internal Medicine

## 2020-07-05 DIAGNOSIS — K5900 Constipation, unspecified: Secondary | ICD-10-CM | POA: Diagnosis not present

## 2020-07-05 DIAGNOSIS — R1031 Right lower quadrant pain: Secondary | ICD-10-CM | POA: Diagnosis not present

## 2020-07-05 DIAGNOSIS — I10 Essential (primary) hypertension: Secondary | ICD-10-CM | POA: Diagnosis not present

## 2020-07-05 DIAGNOSIS — Z859 Personal history of malignant neoplasm, unspecified: Secondary | ICD-10-CM | POA: Diagnosis not present

## 2020-07-05 DIAGNOSIS — K219 Gastro-esophageal reflux disease without esophagitis: Secondary | ICD-10-CM | POA: Diagnosis not present

## 2020-07-05 DIAGNOSIS — E2839 Other primary ovarian failure: Secondary | ICD-10-CM | POA: Diagnosis not present

## 2020-07-05 MED ORDER — IOPAMIDOL (ISOVUE-300) INJECTION 61%
100.0000 mL | Freq: Once | INTRAVENOUS | Status: AC | PRN
  Administered 2020-07-05: 100 mL via INTRAVENOUS

## 2020-07-25 IMAGING — MG DIGITAL SCREENING BILATERAL MAMMOGRAM WITH TOMO AND CAD
8 series · 8 of 24 positions shown · non-contrast
Comparison: None.

CLINICAL DATA: Screening.

EXAM:
DIGITAL SCREENING BILATERAL MAMMOGRAM WITH TOMO AND CAD

[R MLO synth-2D]
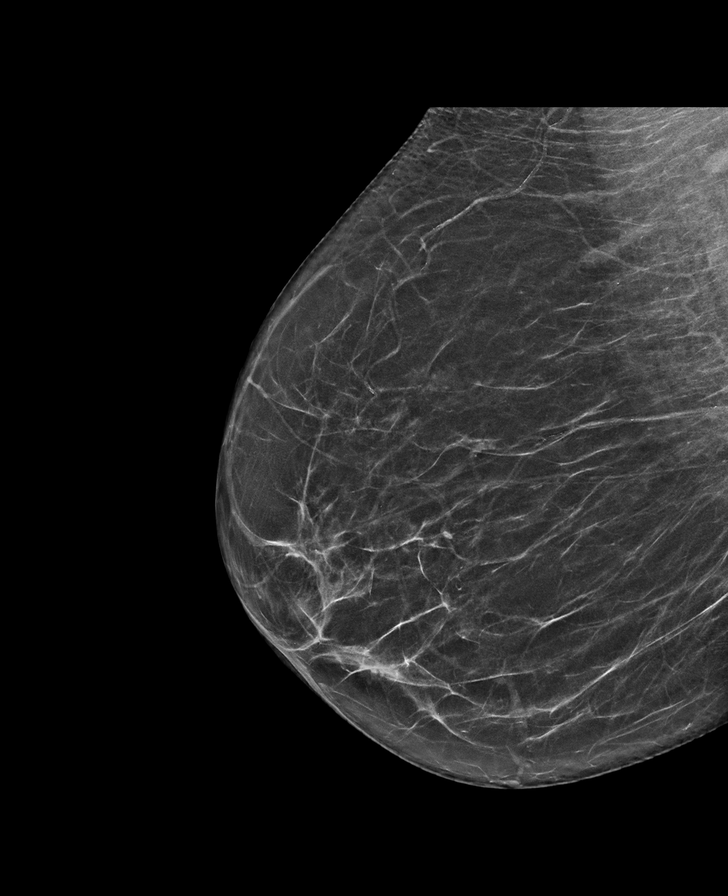

[L CC synth-2D]
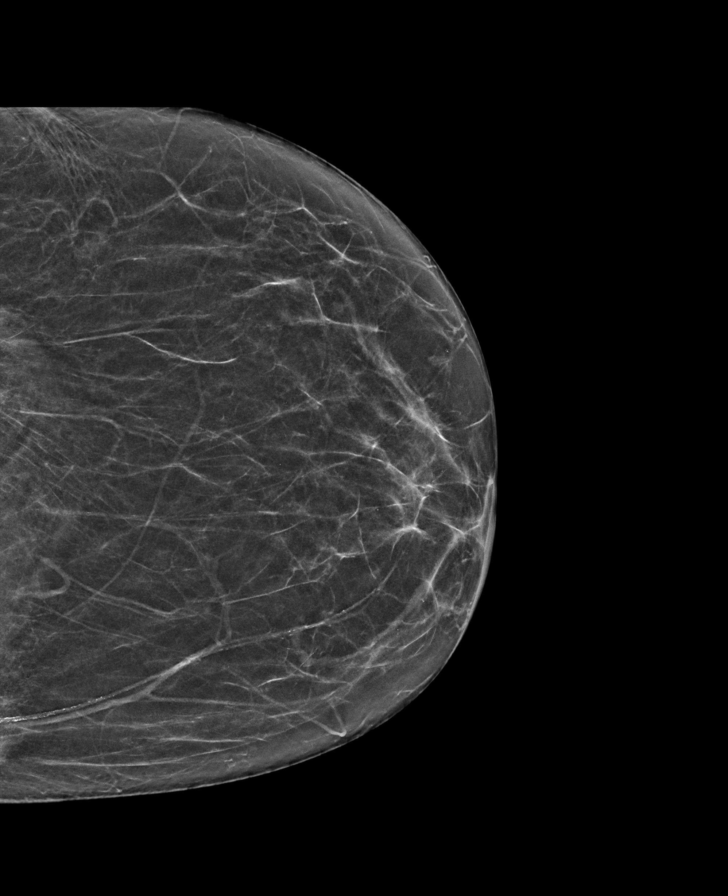

[L MLO synth-2D]
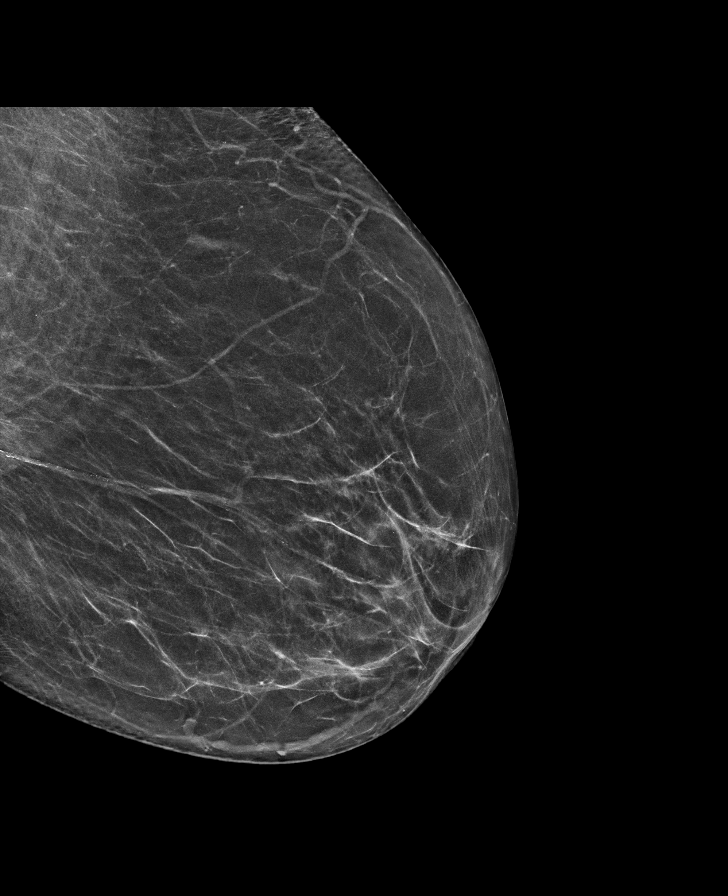

[R CC synth-2D]
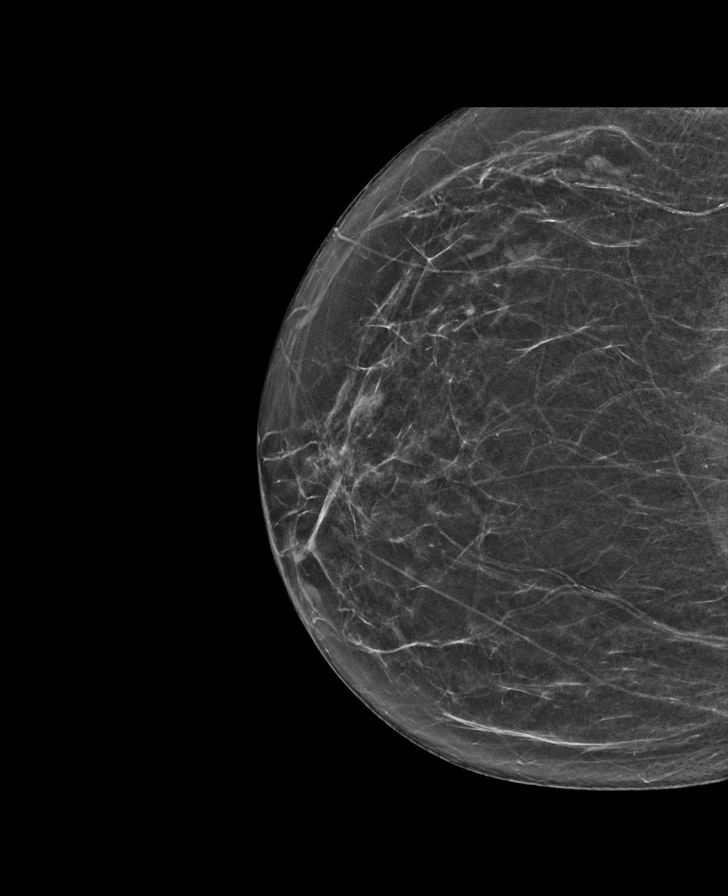

[R MLO tomo · tomo slice 33/66.0]
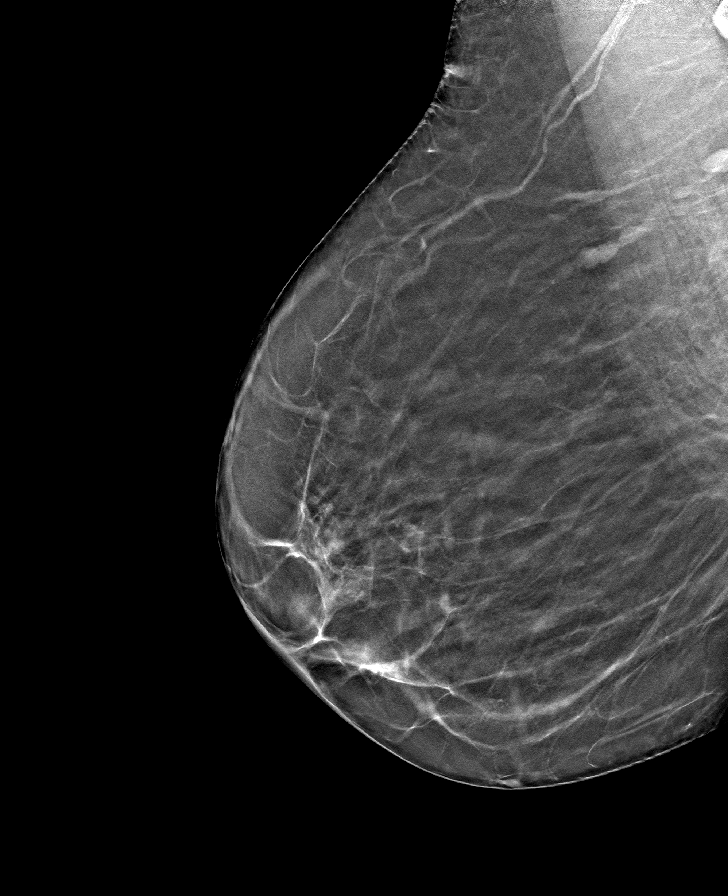

[L MLO tomo · tomo slice 31/61.0]
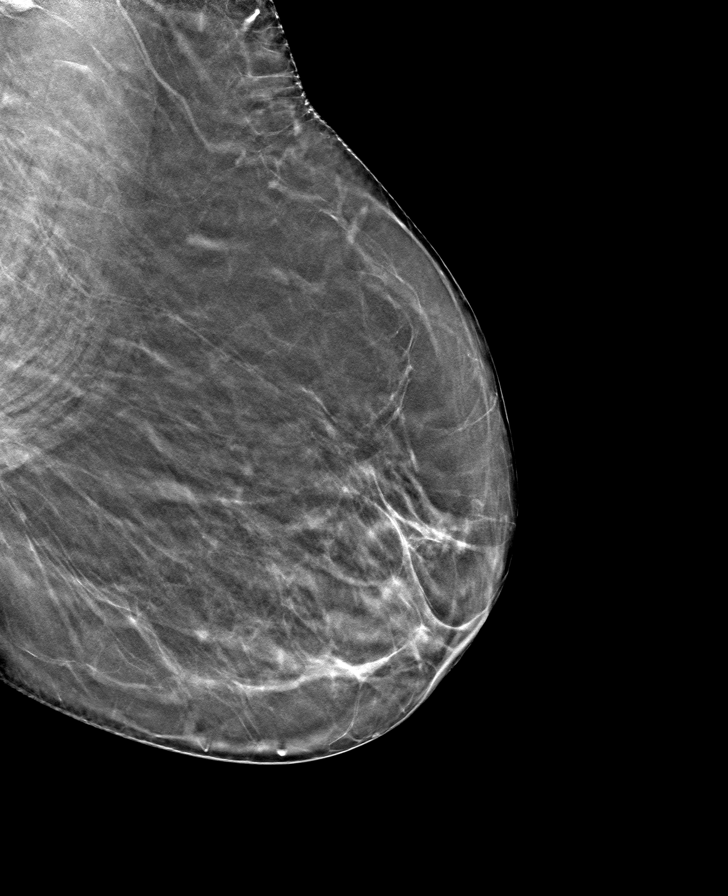

[L CC tomo · tomo slice 29/56.0]
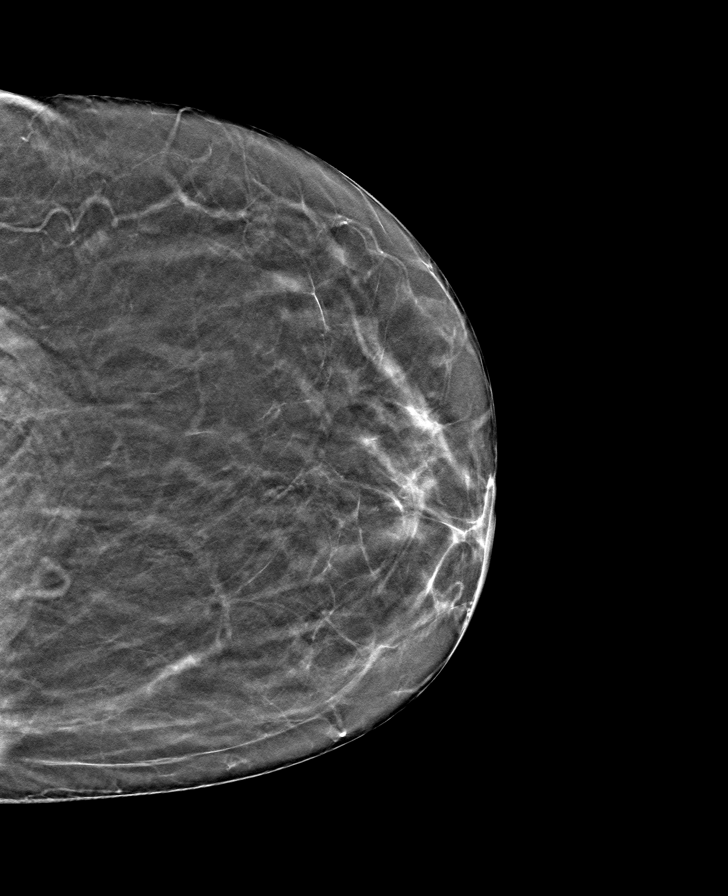

[R CC tomo · tomo slice 29/58.0]
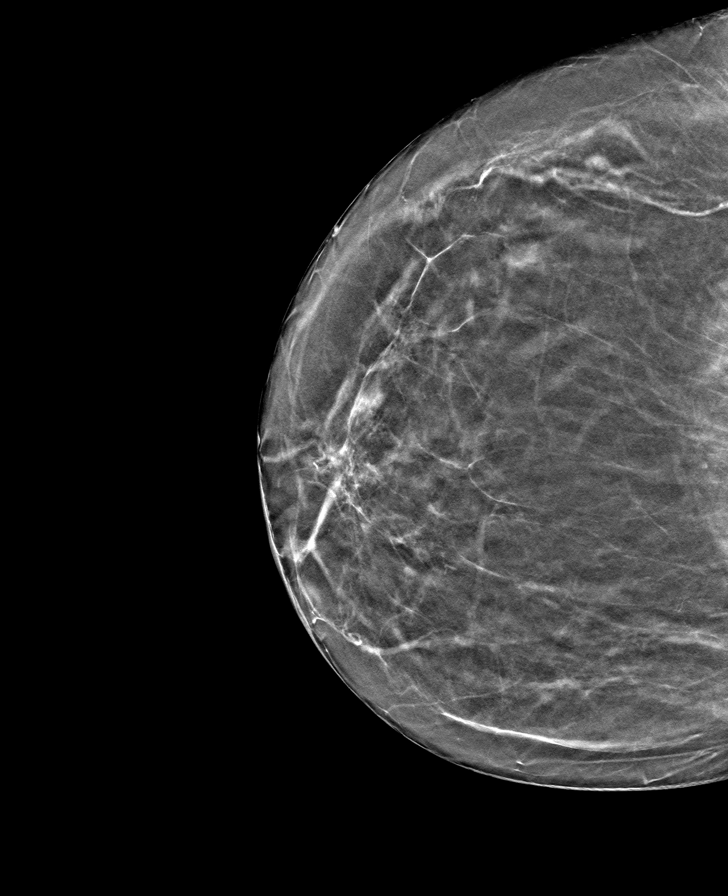

[8 of 24 positions shown; findings below may reference images not displayed]

ACR Breast Density Category b: There are scattered areas of
fibroglandular density.
FINDINGS: There are no findings suspicious for malignancy. Images were
processed with CAD.
IMPRESSION: No mammographic evidence of malignancy. A result letter of this
screening mammogram will be mailed directly to the patient.

RECOMMENDATION:
Screening mammogram in one year. (Code:Y5-G-EJ6)

BI-RADS CATEGORY  1: Negative.

## 2020-07-31 ENCOUNTER — Ambulatory Visit
Admission: RE | Admit: 2020-07-31 | Discharge: 2020-07-31 | Disposition: A | Discharge status: Home / Self Care | Payer: Medicare Other | Source: Ambulatory Visit | Attending: Internal Medicine | Admitting: Internal Medicine

## 2020-07-31 ENCOUNTER — Other Ambulatory Visit: Payer: Self-pay

## 2020-07-31 DIAGNOSIS — Z1231 Encounter for screening mammogram for malignant neoplasm of breast: Secondary | ICD-10-CM | POA: Diagnosis not present

## 2020-07-31 DIAGNOSIS — Z780 Asymptomatic menopausal state: Secondary | ICD-10-CM | POA: Diagnosis not present

## 2020-07-31 DIAGNOSIS — M810 Age-related osteoporosis without current pathological fracture: Secondary | ICD-10-CM

## 2020-07-31 DIAGNOSIS — R309 Painful micturition, unspecified: Secondary | ICD-10-CM | POA: Diagnosis not present

## 2020-08-03 DIAGNOSIS — I10 Essential (primary) hypertension: Secondary | ICD-10-CM | POA: Diagnosis not present

## 2020-08-03 DIAGNOSIS — R300 Dysuria: Secondary | ICD-10-CM | POA: Diagnosis not present

## 2020-08-03 DIAGNOSIS — Z85060 Personal history of malignant carcinoid tumor of small intestine: Secondary | ICD-10-CM | POA: Diagnosis not present

## 2020-08-21 DIAGNOSIS — F5101 Primary insomnia: Secondary | ICD-10-CM | POA: Diagnosis not present

## 2020-08-21 DIAGNOSIS — N3000 Acute cystitis without hematuria: Secondary | ICD-10-CM | POA: Diagnosis not present

## 2020-08-21 DIAGNOSIS — Z85060 Personal history of malignant carcinoid tumor of small intestine: Secondary | ICD-10-CM | POA: Diagnosis not present

## 2020-08-21 DIAGNOSIS — R000 Tachycardia, unspecified: Secondary | ICD-10-CM | POA: Diagnosis not present

## 2020-08-28 DIAGNOSIS — N3000 Acute cystitis without hematuria: Secondary | ICD-10-CM | POA: Diagnosis not present

## 2020-09-12 ENCOUNTER — Other Ambulatory Visit: Payer: Self-pay | Admitting: Cardiology

## 2020-09-12 DIAGNOSIS — I10 Essential (primary) hypertension: Secondary | ICD-10-CM

## 2020-10-13 DIAGNOSIS — H04129 Dry eye syndrome of unspecified lacrimal gland: Secondary | ICD-10-CM | POA: Diagnosis not present

## 2020-10-13 DIAGNOSIS — I10 Essential (primary) hypertension: Secondary | ICD-10-CM | POA: Diagnosis not present

## 2020-10-13 DIAGNOSIS — M810 Age-related osteoporosis without current pathological fracture: Secondary | ICD-10-CM | POA: Diagnosis not present

## 2020-11-02 DIAGNOSIS — E559 Vitamin D deficiency, unspecified: Secondary | ICD-10-CM | POA: Diagnosis not present

## 2020-11-02 DIAGNOSIS — M810 Age-related osteoporosis without current pathological fracture: Secondary | ICD-10-CM | POA: Diagnosis not present

## 2020-11-02 DIAGNOSIS — R5383 Other fatigue: Secondary | ICD-10-CM | POA: Diagnosis not present

## 2020-11-06 DIAGNOSIS — I951 Orthostatic hypotension: Secondary | ICD-10-CM | POA: Diagnosis not present

## 2020-11-06 DIAGNOSIS — I10 Essential (primary) hypertension: Secondary | ICD-10-CM | POA: Diagnosis not present

## 2020-11-06 DIAGNOSIS — H8110 Benign paroxysmal vertigo, unspecified ear: Secondary | ICD-10-CM | POA: Diagnosis not present

## 2020-11-06 DIAGNOSIS — E869 Volume depletion, unspecified: Secondary | ICD-10-CM | POA: Diagnosis not present

## 2020-11-08 DIAGNOSIS — M810 Age-related osteoporosis without current pathological fracture: Secondary | ICD-10-CM | POA: Diagnosis not present

## 2020-12-20 DIAGNOSIS — R062 Wheezing: Secondary | ICD-10-CM | POA: Diagnosis not present

## 2020-12-20 DIAGNOSIS — J069 Acute upper respiratory infection, unspecified: Secondary | ICD-10-CM | POA: Diagnosis not present

## 2021-01-23 DIAGNOSIS — I10 Essential (primary) hypertension: Secondary | ICD-10-CM | POA: Diagnosis not present

## 2021-01-23 DIAGNOSIS — H8110 Benign paroxysmal vertigo, unspecified ear: Secondary | ICD-10-CM | POA: Diagnosis not present

## 2021-01-23 DIAGNOSIS — W19XXXS Unspecified fall, sequela: Secondary | ICD-10-CM | POA: Diagnosis not present

## 2021-03-08 DIAGNOSIS — Z20822 Contact with and (suspected) exposure to covid-19: Secondary | ICD-10-CM | POA: Diagnosis not present

## 2021-03-18 DIAGNOSIS — Z20822 Contact with and (suspected) exposure to covid-19: Secondary | ICD-10-CM | POA: Diagnosis not present

## 2021-03-19 DIAGNOSIS — R350 Frequency of micturition: Secondary | ICD-10-CM | POA: Diagnosis not present

## 2021-03-19 DIAGNOSIS — I10 Essential (primary) hypertension: Secondary | ICD-10-CM | POA: Diagnosis not present

## 2021-03-19 DIAGNOSIS — F5104 Psychophysiologic insomnia: Secondary | ICD-10-CM | POA: Diagnosis not present

## 2021-04-26 ENCOUNTER — Ambulatory Visit: Payer: Medicare Other | Admitting: Cardiology

## 2021-04-27 ENCOUNTER — Encounter: Payer: Self-pay | Admitting: Cardiology

## 2021-04-27 ENCOUNTER — Ambulatory Visit: Payer: Medicare Other | Admitting: Cardiology

## 2021-04-27 ENCOUNTER — Other Ambulatory Visit: Payer: Self-pay

## 2021-04-27 VITALS — BP 137/92 | HR 107 | Temp 98.3°F | Resp 17 | Ht 63.0 in | Wt 189.2 lb

## 2021-04-27 DIAGNOSIS — R000 Tachycardia, unspecified: Secondary | ICD-10-CM

## 2021-04-27 DIAGNOSIS — I10 Essential (primary) hypertension: Secondary | ICD-10-CM

## 2021-04-27 DIAGNOSIS — I4510 Unspecified right bundle-branch block: Secondary | ICD-10-CM

## 2021-04-27 MED ORDER — METOPROLOL TARTRATE 50 MG PO TABS: 50.0000 mg | ORAL_TABLET | Freq: Two times a day (BID) | ORAL | 2 refills | Status: DC

## 2021-04-27 NOTE — Progress Notes (Signed)
Primary Physician/Referring:  Hoyt Koch, MD  Patient ID: Emma Buchanan, female    DOB: [birth year], 80 y.o.   MRN: [id number]  Chief Complaint  Patient presents with   Medication Management   HPI:    Emma Buchanan  is a 80 y.o. 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  The patient presents here today for a cardiac check up. The patient has been feeling well overall and has no acute concerns. Since her last visit, she stopped her Metoprolol tartrate 50 mg BID because she felt that it was causing her to have frequent nocturia. She continues to have nocturia. She also splits her 80 mg Valsartan in half and takes 40 mg in the morning and 40 mg at night. She states that she does this in case the Valsartan 80 mg was causing the nocturia. She checks her BP at home every morning and states that it is around 118/75.   She is also taking Aspirin 81 mg every other day now.  Denies chest pain, SOB, DOE, palpitations, headaches, dizziness, claudication, swelling, and abdominal pain.    Past Medical History:  Diagnosis Date   Cancer [facility])    H/O resection of liver    H/O shoulder surgery    Hypertension    Myocardial infarction [facility])    Past Surgical History:  Procedure Laterality Date   CERVICAL SPINE SURGERY     CHOLECYSTECTOMY     LIVER RESECTION     SHOULDER SURGERY     SMALL INTESTINE SURGERY     Social History   Socioeconomic History   Marital status: Widowed    Spouse name: Not on file   Number of children: 3   Years of education: Not on file   Highest education level: Not on file  Occupational History   Not on file  Tobacco Use   Smoking status: Never   Smokeless tobacco: Never  Vaping Use   Vaping Use: Never used  Substance and Sexual Activity    Alcohol use: No   Drug use: No   Sexual activity: Not on file  Other Topics Concern   Not on file  Social History Narrative   Not on file   Social Determinants of Health   Financial Resource Strain: Not on file  Food Insecurity: Not on file  Transportation Needs: Not on file  Physical Activity: Not on file  Stress: Not on file  Social Connections: Not on file  Intimate Partner Violence: Not on file   ROS  Review of Systems  Constitutional: Negative for weight loss.  Cardiovascular:  Negative for chest pain, claudication, dyspnea on exertion, leg swelling, near-syncope, orthopnea, palpitations and syncope.  Respiratory:  Negative for shortness of breath.   Musculoskeletal:  Positive for arthritis.  Neurological:  Negative for dizziness, headaches and light-headedness.  Objective  Blood pressure (!) 137/92, pulse (!) 107, temperature 98.3 F (36.8 C), temperature source Temporal, resp. rate 17, height '5\' 3"'$  (1.6 m), weight 85.8 kg, SpO2 96 %.  Vitals with BMI 04/27/2021 04/27/2021 09/22/2019  Height - '5\' 3"'$  '5\' 3"'$   Weight - 189 lbs 3 oz 179 lbs  BMI - [id number]  Systolic 0000000 123456 0000000  Diastolic 92 95 78  Pulse [id number] 114 101     Physical Exam Constitutional:  General: She is not in acute distress.    Appearance: Normal appearance.     Comments: She is moderately built and mildly obese in no acute distress.  Neck:     Thyroid: No thyromegaly.     Vascular: No carotid bruit or JVD.  Cardiovascular:     Rate and Rhythm: Regular rhythm. Tachycardia present.     Pulses: Normal pulses and intact distal pulses.     Heart sounds: Normal heart sounds. No murmur heard.   No gallop.  Pulmonary:     Effort: Pulmonary effort is normal. No respiratory distress.     Breath sounds: Normal breath sounds. No wheezing, rhonchi or rales.  Abdominal:     General: Bowel sounds are normal.     Palpations: Abdomen is soft.  Musculoskeletal:        General: No swelling. Normal range of  motion.     Cervical back: Neck supple.  Neurological:     Mental Status: She is alert.  Psychiatric:        Mood and Affect: Mood normal.        Behavior: Behavior normal.   Laboratory examination:   No results for input(s): NA, K, CL, CO2, GLUCOSE, BUN, CREATININE, CALCIUM, GFRNONAA, GFRAA in the last 8760 hours.  CrCl cannot be calculated (Patient's most recent lab result is older than the maximum 21 days allowed.).  CMP Latest Ref Rng & Units 06/03/2019 10/08/2018 10/01/2018  Glucose 70 - 99 mg/dL 104(H) 89 93  BUN 8 - 23 mg/dL 28(H) 14 12  Creatinine 0.44 - 1.00 mg/dL 1.01(H) 0.84 0.74  Sodium 135 - 145 mmol/L 142 138 139  Potassium 3.5 - 5.1 mmol/L 4.6 4.1 4.3  Chloride 98 - 111 mmol/L 105 103 106  CO2 22 - 32 mmol/L '27 26 25  '$ Calcium 8.9 - 10.3 mg/dL 9.7 9.3 9.2  Total Protein 6.5 - 8.1 g/dL 7.4 7.1 6.7  Total Bilirubin 0.3 - 1.2 mg/dL 0.5 0.6 0.4  Alkaline Phos 38 - 126 U/L 102 91 90  AST 15 - 41 U/L '27 22 21  '$ ALT 0 - 44 U/L '19 16 16   '$ CBC Latest Ref Rng & Units 06/03/2019 10/08/2018 10/01/2018  WBC 4.0 - 10.5 K/uL 9.8 7.9 7.5  Hemoglobin 12.0 - 15.0 g/dL 14.6 13.4 13.0  Hematocrit 36.0 - 46.0 % 45.6 41.4 40.0  Platelets 150 - 400 K/uL 234 206 190   Lipid Panel  No results found for: CHOL, TRIG, HDL, CHOLHDL, VLDL, LDLCALC, LDLDIRECT HEMOGLOBIN A1C No results found for: HGBA1C, MPG TSH No results for input(s): TSH in the last 8760 hours.   Medications and allergies   Allergies  Allergen Reactions   Duloxetine Nausea And Vomiting    Cause severe pain   Meperidine Nausea And Vomiting   Nubain [Nalbuphine Hcl]      Current Outpatient Medications  Medication Instructions   Acetaminophen (TYLENOL 8 HOUR ARTHRITIS PAIN PO) 1 tablet, Oral, As needed   aspirin 81 mg, Oral, Daily   Cholecalciferol (VITAMIN D3 PO) 1 capsule, Oral, Daily, 2000IU   DAILY MULTIPLE VITAMINS tablet 1 tablet, Oral, Daily, Centrum silver   lidocaine (LIDODERM) 5 % 1 patch, Daily   metoprolol  tartrate (LOPRESSOR) 50 mg, Oral, 2 times daily   simethicone (MYLICON) 0000000 mg, Oral, Every 6 hours PRN   traMADol (ULTRAM) 50 mg, Oral, 2 times daily   valsartan (DIOVAN) 80 mg, Oral, Daily    Radiology:  No results found.  Cardiac Studies:  Carotid artery duplex 02/09/2018: Minimal atherosclerotic disease at the left carotid bulb. No significant stenosis in the internal carotid arteries. Patent vertebral arteries with antegrade flow.  Echocardiogram  02/22/2016- Left ventricle cavity is normal in size. Normal global wall motion. Normal diastolic filling pattern. Calculated EF 59%. Moderator band in the RV apex (normal variant). Structurally normal mitral valve with trace regurgitation. Mild tricuspid regurgitation. No evidence of pulmonary hypertension. Trace pulmonic regurgitation. Visualized liver appeared to be normal.  Lexiscan myoview stress test 03/17/2017: 1. The resting electrocardiogram demonstrated normal sinus rhythm, normal resting conduction and nonspecific ST-T changes in the inferior and lateral leads. Stress EKG is non-diagnostic for ischemia as it a pharmacologic stress using Lexiscan. Stress symptoms included nausea and jaw pain. 2. Myocardial perfusion imaging is normal. Overall left ventricular systolic function was normal without regional wall motion abnormalities. The left ventricular ejection fraction was 72%.  Assessment     ICD-10-CM   1. Essential hypertension  I10 EKG 12-Lead    metoprolol tartrate (LOPRESSOR) 50 MG tablet    2. RBBB  I45.10 PCV ECHOCARDIOGRAM COMPLETE    3. Sinus tachycardia  R00.0 metoprolol tartrate (LOPRESSOR) 50 MG tablet    PCV ECHOCARDIOGRAM COMPLETE      EKG 04/27/2021: Sinus tachycardia at rate of 109 bpm, left axis deviation, right bundle branch block.  No evidence of ischemia.  EKG 09/22/2019: Normal sinus rhythm at the rate of 92 beats minute, normal axis, poor R-wave progression, cannot extend anteroseptal infarct old.  No  evidence of ischemia.  Low-voltage in the chest leads.   Recommendations:   Meds ordered this encounter  Medications   metoprolol tartrate (LOPRESSOR) 50 MG tablet    Sig: Take 1 tablet (50 mg total) by mouth 2 (two) times daily.    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 in 2009 and in remission, and a negative nuclear stress test in 2018 and a normal echocardiogram in 2017.  The patient is asymptomatic today from a cardiac standpoint and she reports no acute cardiac concerns. Exam in the office today reveals that the patient's BP is elevated to 137/92. Recheck with a manual BP cuff revealed a BP of 160/104 in the left arm with a similar but slightly elevated reading in the right arm. The patient is also tachycardic on EKG. To obtain better BP and heart rate control, the patient was restarted on Metoprolol 50 mg BID. The patient was also instructed to take her 80 mg Valsartan at night and to no longer split the pills.  EKG today revealed RBBB that was not present in her EKG from 2020. Will continue to monitor this with follow up EKGs in the office.   Echocardiogram ordered to evaluate for structural heart changes related to the patient's elevated BP or RBBB and tachycardia.  Consider ordering updated labs at next office visit. She is establishing with Dr. Pricilla Holm as her PCP soon.   The patient was encouraged to lose 5-10 lbs before her next office visit. The patient was also instructed to bring a log of her BP readings to her next visit.  The patient was encouraged to address her frequent nocturia with her PCP.  Follow up in 2 months for HTN, RBBB, and sinus tachycardia with repeat EKG  Summer Lackey, PA-S 04/27/2021, 4:16 PM [facility].  PA Pager: [phone] Office: [phone]  Patient seen  and examined in conjunction with Ms. Adline Potter, [place] second year Ship broker at [employer].  Time spent is in direct patient face to face encounter not including the teaching and training involved.    Adrian Prows, MD, [facility] 04/29/2021, 6:46 PM Office: [phone]

## 2021-05-01 ENCOUNTER — Telehealth: Payer: Self-pay | Admitting: Internal Medicine

## 2021-05-01 NOTE — Telephone Encounter (Signed)
For acute concerns she could contact her current primary care provider or seek care at urgent care or ER.

## 2021-05-01 NOTE — Telephone Encounter (Signed)
Called patient. LVM with Dr. Nathanial Millman recommendations.

## 2021-05-01 NOTE — Telephone Encounter (Signed)
Ok to schedule for this Thursday or Friday? Please advise

## 2021-05-01 NOTE — Telephone Encounter (Signed)
Patient coming in as new patient.. first appt 09.26.22 w/ Dr. Sharlet Salina  Wants to know if she could possibly be seen earlier or what she needs to do  Patient is having pain in right lower quadrant (front & back) & also having pain near her liver  Req callback [phone]

## 2021-05-15 ENCOUNTER — Ambulatory Visit: Payer: Medicare Other

## 2021-05-15 ENCOUNTER — Telehealth: Payer: Self-pay

## 2021-05-15 ENCOUNTER — Other Ambulatory Visit: Payer: Self-pay

## 2021-05-15 DIAGNOSIS — R000 Tachycardia, unspecified: Secondary | ICD-10-CM

## 2021-05-15 DIAGNOSIS — I4510 Unspecified right bundle-branch block: Secondary | ICD-10-CM

## 2021-05-15 NOTE — Telephone Encounter (Signed)
Please continue metoprolol succinate.

## 2021-05-15 NOTE — Telephone Encounter (Signed)
Patient called and stated that after being changed to metoprolol tartrate her BP went up:  Tues:  9pm 178/98 LA  Patient made her own decision to change back to metoprolol succinate and her BP reading was :  Thur : 121/85 LA  Do you want her to stay on Metoprolol Succinate? Please advise.

## 2021-05-16 ENCOUNTER — Other Ambulatory Visit: Payer: Self-pay

## 2021-05-16 MED ORDER — METOPROLOL SUCCINATE ER 50 MG PO TB24: 50.0000 mg | ORAL_TABLET | Freq: Every day | ORAL | 3 refills | Status: DC

## 2021-05-16 NOTE — Telephone Encounter (Signed)
Patient will continue metoprolol succinate. I will refill same.

## 2021-06-15 ENCOUNTER — Ambulatory Visit: Payer: Medicare Other | Admitting: Nurse Practitioner

## 2021-06-18 ENCOUNTER — Ambulatory Visit: Payer: Medicare Other | Admitting: Internal Medicine

## 2021-06-21 ENCOUNTER — Other Ambulatory Visit: Payer: Self-pay

## 2021-06-21 DIAGNOSIS — I10 Essential (primary) hypertension: Secondary | ICD-10-CM

## 2021-06-21 MED ORDER — VALSARTAN 80 MG PO TABS: 80.0000 mg | ORAL_TABLET | Freq: Every day | ORAL | 3 refills | Status: DC

## 2021-06-22 ENCOUNTER — Ambulatory Visit: Payer: Medicare Other | Admitting: Nurse Practitioner

## 2021-06-27 ENCOUNTER — Encounter: Payer: Self-pay | Admitting: Cardiology

## 2021-06-27 ENCOUNTER — Ambulatory Visit: Payer: Medicare Other | Admitting: Cardiology

## 2021-06-27 ENCOUNTER — Other Ambulatory Visit: Payer: Self-pay

## 2021-06-27 VITALS — BP 121/80 | HR 70 | Temp 98.5°F | Resp 17 | Ht 63.0 in | Wt 186.4 lb

## 2021-06-27 DIAGNOSIS — I4510 Unspecified right bundle-branch block: Secondary | ICD-10-CM | POA: Diagnosis not present

## 2021-06-27 DIAGNOSIS — I10 Essential (primary) hypertension: Secondary | ICD-10-CM | POA: Diagnosis not present

## 2021-06-27 MED ORDER — METOPROLOL SUCCINATE ER 50 MG PO TB24: 50.0000 mg | ORAL_TABLET | Freq: Every day | ORAL | 3 refills | Status: DC

## 2021-06-27 NOTE — Progress Notes (Signed)
Primary Physician/Referring:  Hoyt Koch, MD  Patient ID: Emma Buchanan, female    DOB: 09-30-1940, 80 y.o.   MRN: [id number]  Chief Complaint  Patient presents with   Hypertension   rbbb   sinus tachycardia   Abnormal ECG   HPI:    Emma Buchanan  is a 80 y.o. 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  When I seen her 2 months ago, she had discontinued metoprolol tartrate 50 mg p.o. twice daily due to frequency of urination at night, and noticed that she had developed sinus tachycardia.  I restarted her on metoprolol succinate 50 mg daily, she is tolerating this without side effects.  She remains asymptomatic today.   Denies chest pain, SOB, DOE, palpitations, headaches, dizziness, claudication, swelling, and abdominal pain.    Past Medical History:  Diagnosis Date   Cancer [facility])    H/O resection of liver    H/O shoulder surgery    Hypertension    Myocardial infarction [facility])    Past Surgical History:  Procedure Laterality Date   CERVICAL SPINE SURGERY     CHOLECYSTECTOMY     LIVER RESECTION     SHOULDER SURGERY     SMALL INTESTINE SURGERY     Social History   Socioeconomic History   Marital status: Widowed    Spouse name: Not on file   Number of children: 3   Years of education: Not on file   Highest education level: Not on file  Occupational History   Not on file  Tobacco Use   Smoking status: Never   Smokeless tobacco: Never  Vaping Use   Vaping Use: Never used  Substance and Sexual Activity   Alcohol use: No   Drug use: No   Sexual activity: Not on file  Other Topics Concern   Not on file  Social History Narrative   Not on file   Social Determinants of Health   Financial Resource Strain: Not on file  Food Insecurity: Not on file   Transportation Needs: Not on file  Physical Activity: Not on file  Stress: Not on file  Social Connections: Not on file  Intimate Partner Violence: Not on file   ROS  Review of Systems  Cardiovascular:  Negative for chest pain, dyspnea on exertion and leg swelling.  Gastrointestinal:  Negative for melena.  Objective  Blood pressure 121/80, pulse 70, temperature 98.5 F (36.9 C), temperature source Temporal, resp. rate 17, height 5\' 3"  (1.6 m), weight 186 lb 6.4 oz (84.6 kg), SpO2 96 %.  Vitals with BMI 06/27/2021 04/27/2021 04/27/2021  Height 5\' 3"  - 5\' 3"   Weight 186 lbs 6 oz - 189 lbs 3 oz  BMI 95.62 - 13.08  Systolic 657 846 962  Diastolic 80 92 95  Pulse 70 107 114     Physical Exam Constitutional:      Appearance: She is obese.  Neck:     Vascular: No carotid bruit or JVD.  Cardiovascular:     Rate and Rhythm: Normal rate and regular rhythm.     Pulses: Intact distal pulses.     Heart sounds: Normal heart sounds. No murmur heard.   No gallop.  Pulmonary:     Effort: Pulmonary effort is normal.     Breath sounds: Normal breath sounds.  Abdominal:     General: Bowel sounds are normal.     Palpations: Abdomen is soft.  Musculoskeletal:        General: No swelling.   Laboratory examination:   No results for input(s): NA, K, CL, CO2, GLUCOSE, BUN, CREATININE, CALCIUM, GFRNONAA, GFRAA in the last 8760 hours.  CrCl cannot be calculated (Patient's most recent lab result is older than the maximum 21 days allowed.).  CMP Latest Ref Rng & Units 06/03/2019 10/08/2018 10/01/2018  Glucose 70 - 99 mg/dL 104(H) 89 93  BUN 8 - 23 mg/dL 28(H) 14 12  Creatinine 0.44 - 1.00 mg/dL 1.01(H) 0.84 0.74  Sodium 135 - 145 mmol/L 142 138 139  Potassium 3.5 - 5.1 mmol/L 4.6 4.1 4.3  Chloride 98 - 111 mmol/L 105 103 106  CO2 22 - 32 mmol/L 27 26 25   Calcium 8.9 - 10.3 mg/dL 9.7 9.3 9.2  Total Protein 6.5 - 8.1 g/dL 7.4 7.1 6.7  Total Bilirubin 0.3 - 1.2 mg/dL 0.5 0.6 0.4  Alkaline Phos 38 -  126 U/L 102 91 90  AST 15 - 41 U/L 27 22 21   ALT 0 - 44 U/L 19 16 16    CBC Latest Ref Rng & Units 06/03/2019 10/08/2018 10/01/2018  WBC 4.0 - 10.5 K/uL 9.8 7.9 7.5  Hemoglobin 12.0 - 15.0 g/dL 14.6 13.4 13.0  Hematocrit 36.0 - 46.0 % 45.6 41.4 40.0  Platelets 150 - 400 K/uL 234 206 190   Lipid Panel  No results found for: CHOL, TRIG, HDL, CHOLHDL, VLDL, LDLCALC, LDLDIRECT HEMOGLOBIN A1C No results found for: HGBA1C, MPG TSH No results for input(s): TSH in the last 8760 hours.   Medications and allergies   Allergies  Allergen Reactions   Duloxetine Nausea And Vomiting    Cause severe pain   Meperidine Nausea And Vomiting   Nubain [Nalbuphine Hcl]      Current Outpatient Medications  Medication Instructions   Acetaminophen (TYLENOL 8 HOUR ARTHRITIS PAIN PO) 1 tablet, Oral, As needed   aspirin 81 mg, Oral, Daily   Cholecalciferol (VITAMIN D3 PO) 1 capsule, Oral, Daily, 2000IU   DAILY MULTIPLE VITAMINS tablet 1 tablet, Oral, Daily, Centrum silver   lidocaine (LIDODERM) 5 % 1 patch, Daily   metoprolol succinate (TOPROL-XL) 50 mg, Oral, Daily, Take with or immediately following a meal.   simethicone (MYLICON) 836 mg, Oral, Every 6 hours PRN   traMADol (ULTRAM) 50 mg, Oral, 2 times daily   valsartan (DIOVAN) 80 mg, Oral, Daily    Radiology:  No results found.  Cardiac Studies:   Carotid artery duplex 07-Mar-2018: Minimal atherosclerotic disease at the left carotid bulb. No significant stenosis in the internal carotid arteries. Patent vertebral arteries with antegrade flow.  Lexiscan myoview stress test 03/17/2017: 1. The resting electrocardiogram demonstrated normal sinus rhythm, normal resting conduction and nonspecific ST-T changes in the inferior and lateral leads. Stress EKG is non-diagnostic for ischemia as it a pharmacologic stress using Lexiscan. Stress symptoms included nausea and jaw pain. 2. Myocardial perfusion imaging is normal. Overall left ventricular systolic  function was normal without regional wall motion abnormalities. The left ventricular ejection fraction was 72%.  PCV ECHOCARDIOGRAM COMPLETE 05/15/2021  Narrative Echocardiogram 05/15/2021: Left ventricle cavity is normal in size and wall thickness. Normal global wall motion. Normal LV systolic function with EF 68%. Doppler evidence of grade I (impaired) diastolic dysfunction, normal LAP. Mild tricuspid regurgitation. No evidence of pulmonary hypertension. No significant change compared to previous study in 2014 and 2017.  EKG:   EKG 06/27/2021: Normal sinus rhythm with rate of 80 bpm, normal axis scratch that leftward axis.  Right bundle branch block.  Compared to 04/27/2021, sinus tachycardia no longer present.  Compared to 09/22/2019, right bundle branch block is new.     Assessment     ICD-10-CM   1. Essential hypertension  I10 EKG 12-Lead    2. RBBB  I45.10       Recommendations:   No orders of the defined types were placed in this encounter.   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  When I seen her 2 months ago, she had discontinued metoprolol tartrate 50 mg p.o. twice daily due to frequency of urination at night, and noticed that she had developed sinus tachycardia.  I restarted her on metoprolol succinate 50 mg daily, she is tolerating this without side effects.  She remains asymptomatic today.   She persist to have right bundle branch block but this is of no clinical consequence.  Her heart rate is much improved from sinus tachycardia.  I suspect she probably will need low-dose of a beta-blocker to be continued for now.  Only if she did develop bradycardia or any near syncopal spell, then we could certainly consider discontinuing beta-blocker  therapy as she is 80 years of age.  But otherwise stable from cardiac standpoint, I will see her back on a as needed basis.    Adrian Prows, MD, [facility] 06/27/2021, 3:15 PM Office: [phone]

## 2021-06-28 ENCOUNTER — Encounter: Payer: Self-pay | Admitting: Nurse Practitioner

## 2021-06-28 ENCOUNTER — Ambulatory Visit (INDEPENDENT_AMBULATORY_CARE_PROVIDER_SITE_OTHER): Payer: Medicare Other | Admitting: Nurse Practitioner

## 2021-06-28 VITALS — BP 126/80 | HR 96 | Temp 98.3°F | Resp 18 | Ht 63.0 in | Wt 185.2 lb

## 2021-06-28 DIAGNOSIS — Z13220 Encounter for screening for lipoid disorders: Secondary | ICD-10-CM | POA: Diagnosis not present

## 2021-06-28 DIAGNOSIS — E669 Obesity, unspecified: Secondary | ICD-10-CM

## 2021-06-28 DIAGNOSIS — I10 Essential (primary) hypertension: Secondary | ICD-10-CM | POA: Diagnosis not present

## 2021-06-28 DIAGNOSIS — Z6833 Body mass index (BMI) 33.0-33.9, adult: Secondary | ICD-10-CM

## 2021-06-28 DIAGNOSIS — Z1329 Encounter for screening for other suspected endocrine disorder: Secondary | ICD-10-CM

## 2021-06-28 DIAGNOSIS — Z131 Encounter for screening for diabetes mellitus: Secondary | ICD-10-CM

## 2021-06-28 LAB — CBC WITH DIFFERENTIAL/PLATELET
Basophils Absolute: 0 10*3/uL (ref 0.0–0.1)
Basophils Relative: 0.3 % (ref 0.0–3.0)
Eosinophils Absolute: 0.2 10*3/uL (ref 0.0–0.7)
Eosinophils Relative: 2 % (ref 0.0–5.0)
HCT: 42.3 % (ref 36.0–46.0)
Hemoglobin: 13.7 g/dL (ref 12.0–15.0)
Lymphocytes Relative: 26.2 % (ref 12.0–46.0)
Lymphs Abs: 2.2 10*3/uL (ref 0.7–4.0)
MCHC: 32.5 g/dL (ref 30.0–36.0)
MCV: 90.3 fl (ref 78.0–100.0)
Monocytes Absolute: 0.8 10*3/uL (ref 0.1–1.0)
Monocytes Relative: 10.2 % (ref 3.0–12.0)
Neutro Abs: 5 10*3/uL (ref 1.4–7.7)
Neutrophils Relative %: 61.3 % (ref 43.0–77.0)
Platelets: 216 10*3/uL (ref 150.0–400.0)
RBC: 4.68 Mil/uL (ref 3.87–5.11)
RDW: 14.3 % (ref 11.5–15.5)
WBC: 8.2 10*3/uL (ref 4.0–10.5)

## 2021-06-28 LAB — COMPREHENSIVE METABOLIC PANEL
ALT: 19 U/L (ref 0–35)
AST: 30 U/L (ref 0–37)
Albumin: 4.3 g/dL (ref 3.5–5.2)
Alkaline Phosphatase: 79 U/L (ref 39–117)
BUN: 13 mg/dL (ref 6–23)
CO2: 29 mEq/L (ref 19–32)
Calcium: 9.7 mg/dL (ref 8.4–10.5)
Chloride: 101 mEq/L (ref 96–112)
Creatinine, Ser: 0.8 mg/dL (ref 0.40–1.20)
GFR: 70.01 mL/min (ref >60.00)
Glucose, Bld: 88 mg/dL (ref 70–99)
Potassium: 4.4 mEq/L (ref 3.5–5.1)
Sodium: 137 mEq/L (ref 135–145)
Total Bilirubin: 0.7 mg/dL (ref 0.2–1.2)
Total Protein: 7.1 g/dL (ref 6.0–8.3)

## 2021-06-28 LAB — TSH: TSH: 2.16 u[IU]/mL (ref 0.35–5.50)

## 2021-06-28 LAB — LIPID PANEL
Cholesterol: 171 mg/dL (ref 0–200)
HDL: 66.5 mg/dL (ref >39.00)
LDL Cholesterol: 82 mg/dL (ref 0–99)
NonHDL: 104.72
Total CHOL/HDL Ratio: 3
Triglycerides: 115 mg/dL (ref 0.0–149.0)
VLDL: 23 mg/dL (ref 0.0–40.0)

## 2021-06-28 NOTE — Progress Notes (Signed)
Subjective:  Patient ID: Emma Buchanan, female    DOB: 10-26-1940  Age: 80 y.o. MRN: [id number]  CC:  Chief Complaint  Patient presents with   New Patient (Initial Visit)    No concerns.       HPI  This patient arrives today for the above.  She has no acute concerns today.  No recent blood work in the system noted.  She does not request any refills today.   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 (had surgery 2009 at [facility])   Chicken pox    Encounter for blood transfusion    GERD (gastroesophageal reflux disease)    H/O resection of liver    H/O shoulder surgery    Hepatitis A    Age 80   Hypertension    Myocardial infarction [facility])    Urinary incontinence    UTI (urinary tract infection)       Family History  Problem Relation Age of Onset   Heart disease Mother    Arthritis Mother    Cancer Mother 52       bladder cancer   Asthma Father    Cancer Brother        throat cancer    Cancer Brother        pancreatic cancer     Social History   Social History Narrative   Not on file   Social History   Tobacco Use   Smoking status: Never   Smokeless tobacco: Never  Substance Use Topics   Alcohol use: No     Current Meds  Medication Sig   Acetaminophen (TYLENOL 8 HOUR ARTHRITIS PAIN PO) Take 1 tablet by mouth as needed.   aspirin 81 MG tablet Take 81 mg by mouth daily.   Cholecalciferol (VITAMIN D3 PO) Take 1 capsule by mouth daily. 2000IU   DAILY MULTIPLE VITAMINS tablet Take 1 tablet by mouth daily. Centrum silver   lidocaine (LIDODERM) 5 % 1 patch daily.   loperamide (IMODIUM A-D) 2 MG capsule Take 2 mg by mouth every 6 (six) hours as needed for diarrhea or loose stools.   LORazepam (ATIVAN) 0.5 MG tablet Take 0.5 mg by mouth at bedtime as needed for anxiety.   metoprolol succinate (TOPROL-XL) 50 MG 24 hr tablet Take 1 tablet (50 mg  total) by mouth daily. Take with or immediately following a meal.   omeprazole (PRILOSEC) 20 MG capsule Take 20 mg by mouth daily as needed.   simethicone (MYLICON) 852 MG chewable tablet Chew 125 mg by mouth every 6 (six) hours as needed for flatulence.   traMADol (ULTRAM) 50 MG tablet Take 50 mg by mouth 2 (two) times daily.    valsartan (DIOVAN) 80 MG tablet Take 1 tablet (80 mg total) by mouth daily.    ROS:  Review of Systems  Respiratory:  Negative for shortness of breath.   Cardiovascular:  Negative for chest pain.  Gastrointestinal:  Negative for abdominal pain.  Neurological:  Negative for headaches.    Objective:   Today's Vitals: BP 126/80   Pulse 96   Temp 98.3 F (36.8 C) (Oral)   Resp 18   Ht 5\' 3"  (1.6 m)   Wt 185 lb 3.2 oz (84 kg)   SpO2 94%   BMI 32.81 kg/m  Vitals with BMI 06/28/2021 06/27/2021 04/27/2021  Height  5\' 3"  5\' 3"  -  Weight 185 lbs 3 oz 186 lbs 6 oz -  BMI 10.17 51.02 -  Systolic 585 277 824  Diastolic 80 80 92  Pulse 96 70 107     Physical Exam Vitals reviewed.  Constitutional:      General: She is not in acute distress.    Appearance: Normal appearance.  HENT:     Head: Normocephalic and atraumatic.  Neck:     Vascular: No carotid bruit.  Cardiovascular:     Rate and Rhythm: Normal rate and regular rhythm.     Pulses: Normal pulses.     Heart sounds: Normal heart sounds.  Pulmonary:     Effort: Pulmonary effort is normal.     Breath sounds: Normal breath sounds.  Skin:    General: Skin is warm and dry.  Neurological:     General: No focal deficit present.     Mental Status: She is alert and oriented to person, place, and time.  Psychiatric:        Mood and Affect: Mood normal.        Behavior: Behavior normal.        Judgment: Judgment normal.         Assessment and Plan   1. Thyroid disorder screen   2. Screening for diabetes mellitus   3. Screening for lipid disorders   4. Hypertension, unspecified type   5. Class  1 obesity with serious comorbidity and body mass index (BMI) of 33.0 to 33.9 in adult, unspecified obesity type      Plan: 1.-5.  We will collect blood work for further evaluation today.  She will continue with her chronic medications as currently prescribed and will continue to follow-up with her cardiologist as needed.   Tests ordered Orders Placed This Encounter  Procedures   TSH   Lipid panel   Comprehensive metabolic panel   CBC with Differential/Platelet      No orders of the defined types were placed in this encounter.   Patient to follow-up in 3 months with Dr. Sharlet Salina, or sooner as needed  Ailene Ards, NP

## 2021-07-24 ENCOUNTER — Ambulatory Visit (INDEPENDENT_AMBULATORY_CARE_PROVIDER_SITE_OTHER): Payer: Medicare Other | Admitting: Internal Medicine

## 2021-07-24 ENCOUNTER — Encounter: Payer: Self-pay | Admitting: Internal Medicine

## 2021-07-24 ENCOUNTER — Other Ambulatory Visit: Payer: Self-pay

## 2021-07-24 VITALS — BP 126/88 | HR 108 | Resp 18 | Ht 63.0 in | Wt 185.2 lb

## 2021-07-24 DIAGNOSIS — R350 Frequency of micturition: Secondary | ICD-10-CM | POA: Diagnosis not present

## 2021-07-24 LAB — POCT URINALYSIS DIPSTICK
Bilirubin, UA: NEGATIVE
Glucose, UA: NEGATIVE
Ketones, UA: NEGATIVE
Nitrite, UA: NEGATIVE
Protein, UA: POSITIVE — AB
Spec Grav, UA: >=1.03 — AB (ref 1.010–1.025)
Urobilinogen, UA: 0.2 E.U./dL
pH, UA: 5 (ref 5.0–8.0)

## 2021-07-24 MED ORDER — SULFAMETHOXAZOLE-TRIMETHOPRIM 800-160 MG PO TABS: 1.0000 | ORAL_TABLET | Freq: Two times a day (BID) | ORAL | 0 refills | Status: DC

## 2021-07-24 MED ORDER — ESTRADIOL 0.1 MG/GM VA CREA: 1.0000 | TOPICAL_CREAM | Freq: Every day | VAGINAL | 12 refills | Status: DC

## 2021-07-24 NOTE — Assessment & Plan Note (Signed)
U/A done in office and consistent with infection. Rx bactrim 5 day course and estrace cream to start to see if this helps reduce infections overall.

## 2021-07-24 NOTE — Patient Instructions (Signed)
We have sent in bactrim to take 1 pill twice a day for 5 days.  We have also sent in the vaginal estrogen cream to use daily for 2 weeks and then 3 times a week to help possibly prevent the utis.

## 2021-07-24 NOTE — Progress Notes (Signed)
   Subjective:   Patient ID: Emma Buchanan, female    DOB: 1941/09/08, 79 y.o.   MRN: [id number]  HPI The patient is a 80 YO female coming in for possible UTI. Has frequent maybe monthly.   Review of Systems  Constitutional: Negative.   Respiratory: Negative.    Cardiovascular: Negative.   Gastrointestinal:  Negative for abdominal distention, abdominal pain, constipation, diarrhea, nausea and vomiting.  Genitourinary:  Positive for dysuria, frequency and urgency.  Musculoskeletal: Negative.   Skin: Negative.    Objective:  Physical Exam Constitutional:      Appearance: She is well-developed.  HENT:     Head: Normocephalic and atraumatic.  Cardiovascular:     Rate and Rhythm: Normal rate and regular rhythm.  Pulmonary:     Effort: Pulmonary effort is normal. No respiratory distress.     Breath sounds: Normal breath sounds. No wheezing or rales.  Abdominal:     General: Bowel sounds are normal. There is no distension.     Palpations: Abdomen is soft.     Tenderness: There is no abdominal tenderness. There is no rebound.  Musculoskeletal:     Cervical back: Normal range of motion.  Skin:    General: Skin is warm and dry.  Neurological:     Mental Status: She is alert and oriented to person, place, and time.     Coordination: Coordination normal.    Vitals:   07/24/21 1029  BP: 126/88  Pulse: (!) 108  Resp: 18  SpO2: 98%  Weight: 185 lb 3.2 oz (84 kg)  Height: 5\' 3"  (1.6 m)    This visit occurred during the SARS-CoV-2 public health emergency.  Safety protocols were in place, including screening questions prior to the visit, additional usage of staff PPE, and extensive cleaning of exam room while observing appropriate contact time as indicated for disinfecting solutions.   Assessment & Plan:

## 2021-08-27 DIAGNOSIS — M961 Postlaminectomy syndrome, not elsewhere classified: Secondary | ICD-10-CM | POA: Diagnosis not present

## 2021-08-27 DIAGNOSIS — F341 Dysthymic disorder: Secondary | ICD-10-CM | POA: Diagnosis not present

## 2021-08-27 DIAGNOSIS — Z6831 Body mass index (BMI) 31.0-31.9, adult: Secondary | ICD-10-CM | POA: Diagnosis not present

## 2021-08-27 DIAGNOSIS — M461 Sacroiliitis, not elsewhere classified: Secondary | ICD-10-CM | POA: Diagnosis not present

## 2021-08-27 DIAGNOSIS — F411 Generalized anxiety disorder: Secondary | ICD-10-CM | POA: Diagnosis not present

## 2021-08-27 DIAGNOSIS — M47812 Spondylosis without myelopathy or radiculopathy, cervical region: Secondary | ICD-10-CM | POA: Diagnosis not present

## 2021-10-04 ENCOUNTER — Ambulatory Visit: Payer: Medicare Other | Admitting: Nurse Practitioner

## 2021-10-11 ENCOUNTER — Other Ambulatory Visit: Payer: Self-pay

## 2021-10-11 ENCOUNTER — Ambulatory Visit (INDEPENDENT_AMBULATORY_CARE_PROVIDER_SITE_OTHER): Payer: Medicare Other | Admitting: Nurse Practitioner

## 2021-10-11 ENCOUNTER — Other Ambulatory Visit: Payer: Self-pay | Admitting: Nurse Practitioner

## 2021-10-11 ENCOUNTER — Encounter: Payer: Self-pay | Admitting: Nurse Practitioner

## 2021-10-11 ENCOUNTER — Other Ambulatory Visit: Payer: Medicare Other

## 2021-10-11 VITALS — BP 132/86 | HR 86 | Temp 98.2°F | Ht 63.0 in | Wt 188.6 lb

## 2021-10-11 DIAGNOSIS — R197 Diarrhea, unspecified: Secondary | ICD-10-CM | POA: Insufficient documentation

## 2021-10-11 DIAGNOSIS — R300 Dysuria: Secondary | ICD-10-CM | POA: Diagnosis not present

## 2021-10-11 DIAGNOSIS — E2839 Other primary ovarian failure: Secondary | ICD-10-CM | POA: Insufficient documentation

## 2021-10-11 DIAGNOSIS — L209 Atopic dermatitis, unspecified: Secondary | ICD-10-CM | POA: Insufficient documentation

## 2021-10-11 DIAGNOSIS — R32 Unspecified urinary incontinence: Secondary | ICD-10-CM | POA: Diagnosis not present

## 2021-10-11 DIAGNOSIS — G4700 Insomnia, unspecified: Secondary | ICD-10-CM | POA: Diagnosis not present

## 2021-10-11 DIAGNOSIS — L719 Rosacea, unspecified: Secondary | ICD-10-CM | POA: Insufficient documentation

## 2021-10-11 DIAGNOSIS — N949 Unspecified condition associated with female genital organs and menstrual cycle: Secondary | ICD-10-CM | POA: Insufficient documentation

## 2021-10-11 DIAGNOSIS — M1990 Unspecified osteoarthritis, unspecified site: Secondary | ICD-10-CM

## 2021-10-11 DIAGNOSIS — R413 Other amnesia: Secondary | ICD-10-CM | POA: Insufficient documentation

## 2021-10-11 DIAGNOSIS — N3000 Acute cystitis without hematuria: Secondary | ICD-10-CM

## 2021-10-11 DIAGNOSIS — G453 Amaurosis fugax: Secondary | ICD-10-CM | POA: Insufficient documentation

## 2021-10-11 LAB — URINALYSIS WITH CULTURE, IF INDICATED
Bilirubin Urine: NEGATIVE
Hgb urine dipstick: NEGATIVE
Ketones, ur: NEGATIVE
Leukocytes,Ua: NEGATIVE
Nitrite: NEGATIVE
RBC / HPF: NONE SEEN (ref 0–?)
Specific Gravity, Urine: 1.025 (ref 1.000–1.030)
Total Protein, Urine: NEGATIVE
Urine Glucose: NEGATIVE
Urobilinogen, UA: 0.2 (ref 0.0–1.0)
pH: 5.5 (ref 5.0–8.0)

## 2021-10-11 MED ORDER — HYDROXYZINE HCL 25 MG PO TABS: 12.5000 mg | ORAL_TABLET | Freq: Every evening | ORAL | 2 refills | Status: AC | PRN

## 2021-10-11 MED ORDER — SULFAMETHOXAZOLE-TRIMETHOPRIM 800-160 MG PO TABS: 1.0000 | ORAL_TABLET | Freq: Two times a day (BID) | ORAL | 0 refills | Status: DC

## 2021-10-11 NOTE — Progress Notes (Signed)
Subjective:  Patient ID: Emma Buchanan, female    DOB: 1941/01/03  Age: 81 y.o. MRN: [id number]  CC:  Chief Complaint  Patient presents with   Follow-up    Dysuria, osteoarthritis       HPI  This patient arrives today for the above.  She was seen approximate 1 month ago for UTI.  Today she continues to have dysuria as well as flank pain.  She was treated with Bactrim and tells me she tolerated the medication well and did feel that it appropriately fixed her UTI at last visit.  She does have frequent UTIs and she was prescribed estrace to see if this would help preventing frequent UTIs, however the patient has discontinued this and tells me she does not feel that it helps.  She experiences frequent urinary incontinence and wears incontinent pads daily.  She tells me she does not bathe daily but multiple times a week.  She does wipe from front to back.  She also is experiencing diffuse joint pain.  She tells me she has been evaluated and had rheumatoid arthritis ruled out.  She was diagnosed with osteoarthritis.  She is wondering what she can do to treat her pain.  She also reports difficulty sleeping.  She takes tramadol as needed for her arthritis, and is wondering if she can take Xanax as needed for sleep.  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 (had surgery 2009 at [facility])   Chicken pox    Encounter for blood transfusion    GERD (gastroesophageal reflux disease)    H/O resection of liver    H/O shoulder surgery    Hepatitis A    Age 18   Hypertension    Myocardial infarction [facility])    Urinary incontinence    UTI (urinary tract infection)       Family History  Problem Relation Age of Onset   Heart disease Mother    Arthritis Mother    Cancer Mother 64       bladder cancer   Asthma Father    Cancer Brother        throat cancer    Cancer Brother         pancreatic cancer     Social History   Social History Narrative   Not on file   Social History   Tobacco Use   Smoking status: Never   Smokeless tobacco: Never  Substance Use Topics   Alcohol use: No     Current Meds  Medication Sig   Acetaminophen (TYLENOL 8 HOUR ARTHRITIS PAIN PO) Take 1 tablet by mouth as needed.   aspirin 81 MG tablet Take 81 mg by mouth daily.   Cholecalciferol (VITAMIN D3 PO) Take 1 capsule by mouth daily. 2000IU   DAILY MULTIPLE VITAMINS tablet Take 1 tablet by mouth daily. Centrum silver   hydrOXYzine (ATARAX) 25 MG tablet Take 0.5-1 tablets (12.5-25 mg total) by mouth at bedtime as needed.   lidocaine (LIDODERM) 5 % 1 patch daily.   loperamide (IMODIUM) 2 MG capsule Take 2 mg by mouth every 6 (six) hours as needed for diarrhea or loose stools.   metoprolol succinate (TOPROL-XL) 50 MG 24 hr tablet Take 1 tablet (50 mg total) by mouth daily. Take with or immediately following a meal.   traMADol (ULTRAM) 50 MG tablet Take 50 mg  by mouth 2 (two) times daily.    valsartan (DIOVAN) 80 MG tablet Take 1 tablet (80 mg total) by mouth daily.    ROS:  Review of Systems  Respiratory:  Negative for shortness of breath.   Cardiovascular:  Negative for chest pain.  Genitourinary:  Positive for dysuria and flank pain.  Neurological:  Negative for dizziness and headaches.    Objective:   Today's Vitals: BP 132/86 (BP Location: Left Arm, Patient Position: Sitting, Cuff Size: Normal)    Pulse 86    Temp 98.2 F (36.8 C) (Oral)    Ht 5\' 3"  (1.6 m)    Wt 188 lb 9.6 oz (85.5 kg)    SpO2 95%    BMI 33.41 kg/m  Vitals with BMI 10/11/2021 07/24/2021 06/28/2021  Height 5\' 3"  5\' 3"  5\' 3"   Weight 188 lbs 10 oz 185 lbs 3 oz 185 lbs 3 oz  BMI 33.42 48.54 62.70  Systolic 350 093 818  Diastolic 86 88 80  Pulse 86 108 96     Physical Exam Vitals reviewed.  Constitutional:      General: She is not in acute distress.    Appearance: Normal appearance.  HENT:     Head:  Normocephalic and atraumatic.  Neck:     Vascular: No carotid bruit.  Cardiovascular:     Rate and Rhythm: Normal rate and regular rhythm.     Pulses: Normal pulses.     Heart sounds: Normal heart sounds.  Pulmonary:     Effort: Pulmonary effort is normal.     Breath sounds: Normal breath sounds.  Skin:    General: Skin is warm and dry.  Neurological:     General: No focal deficit present.     Mental Status: She is alert and oriented to person, place, and time.  Psychiatric:        Mood and Affect: Mood normal.        Behavior: Behavior normal.        Judgment: Judgment normal.         Assessment and Plan   1. Dysuria   2. Urinary incontinence, unspecified type   3. Insomnia, unspecified type   4. Osteoarthritis, unspecified osteoarthritis type, unspecified site      Plan: 1.,  2.  We will check urinalysis with reflex to culture today for further evaluation.  I recommended she try to bathe daily and change her incontinence pads frequently.  I also encouraged her to continue wiping from front to back.  May consider trialing Ditropan or Myrbetriq for urinary incontinence. 3.  Because she is on tramadol I recommended against benzodiazepine use for insomnia.  Instead, we will trial hydroxyzine.  We will need to discuss at next follow-up to see how she is tolerating this. 4.  For now I have recommended supportive measures.  I recommend she continue taking her tramadol as needed, as well as Tylenol.  We also discussed participating in regular nonweightbearing exercise such as recumbent bike to help keep her mobility.  Could consider referral to physical therapy if needed.   Tests ordered Orders Placed This Encounter  Procedures   Urinalysis with Culture, if indicated      Meds ordered this encounter  Medications   hydrOXYzine (ATARAX) 25 MG tablet    Sig: Take 0.5-1 tablets (12.5-25 mg total) by mouth at bedtime as needed.    Dispense:  30 tablet    Refill:  2     Order Specific Question:  Supervising Provider    Answer:   Binnie Rail [[id number]]    Patient to follow-up in 3 months or sooner as needed  Ailene Ards, NP

## 2021-10-11 NOTE — Progress Notes (Signed)
Ordering bactrim for UTI

## 2021-10-13 LAB — URINE CULTURE
MICRO NUMBER:: 12892418
SPECIMEN QUALITY:: ADEQUATE

## 2021-11-02 DIAGNOSIS — H2513 Age-related nuclear cataract, bilateral: Secondary | ICD-10-CM | POA: Diagnosis not present

## 2021-11-02 DIAGNOSIS — H353133 Nonexudative age-related macular degeneration, bilateral, advanced atrophic without subfoveal involvement: Secondary | ICD-10-CM | POA: Diagnosis not present

## 2021-11-02 DIAGNOSIS — H43393 Other vitreous opacities, bilateral: Secondary | ICD-10-CM | POA: Diagnosis not present

## 2021-11-05 ENCOUNTER — Telehealth: Payer: Self-pay

## 2021-11-05 DIAGNOSIS — I10 Essential (primary) hypertension: Secondary | ICD-10-CM

## 2021-11-05 DIAGNOSIS — R000 Tachycardia, unspecified: Secondary | ICD-10-CM

## 2021-11-05 MED ORDER — VERAPAMIL HCL ER 180 MG PO TBCR: 180.0000 mg | EXTENDED_RELEASE_TABLET | Freq: Every day | ORAL | 2 refills | Status: DC

## 2021-11-05 NOTE — Telephone Encounter (Signed)
Pt called and stated that she thinks her metoprolol is making her hair fall out. Pt would like to be taken off of this and put on something else.

## 2021-11-05 NOTE — Telephone Encounter (Signed)
ICD-10-CM   1. Essential hypertension  I10 verapamil (CALAN-SR) 180 MG CR tablet    2. Sinus tachycardia  R00.0 verapamil (CALAN-SR) 180 MG CR tablet     Medications Discontinued During This Encounter  Medication Reason   metoprolol succinate (TOPROL-XL) 50 MG 24 hr tablet Side effect (s)    Allergies  Allergen Reactions   Duloxetine Nausea And Vomiting    Cause severe pain   Cefdinir Other (See Comments)   Meperidine Nausea And Vomiting   Meperidine Hcl Other (See Comments)   Metoprolol Succinate [Metoprolol] Other (See Comments)    Hair loss   Nalbuphine Hcl Other (See Comments)

## 2021-11-06 NOTE — Telephone Encounter (Signed)
Patient called back, transferred to our pharmacist.

## 2021-11-06 NOTE — Telephone Encounter (Signed)
Called pt, no answer. Left vm requesting call back?

## 2021-11-07 NOTE — Telephone Encounter (Signed)
Reviewed with pt. Discussed indication, side effects, monitoring parameter with pt. Pt agreeable to continue with transitioning to verapamil and monitoring her HR and concerns of hair loss. Pt agrees to discuss with her PCP to rule out any other reasons for her hair loss concerns.

## 2021-11-20 DIAGNOSIS — M47812 Spondylosis without myelopathy or radiculopathy, cervical region: Secondary | ICD-10-CM | POA: Diagnosis not present

## 2021-11-20 DIAGNOSIS — F341 Dysthymic disorder: Secondary | ICD-10-CM | POA: Diagnosis not present

## 2021-11-20 DIAGNOSIS — M545 Low back pain, unspecified: Secondary | ICD-10-CM | POA: Diagnosis not present

## 2021-11-20 DIAGNOSIS — G8929 Other chronic pain: Secondary | ICD-10-CM | POA: Diagnosis not present

## 2021-11-20 DIAGNOSIS — M461 Sacroiliitis, not elsewhere classified: Secondary | ICD-10-CM | POA: Diagnosis not present

## 2021-11-20 DIAGNOSIS — F411 Generalized anxiety disorder: Secondary | ICD-10-CM | POA: Diagnosis not present

## 2021-11-20 DIAGNOSIS — M961 Postlaminectomy syndrome, not elsewhere classified: Secondary | ICD-10-CM | POA: Diagnosis not present

## 2021-12-03 DIAGNOSIS — M47812 Spondylosis without myelopathy or radiculopathy, cervical region: Secondary | ICD-10-CM | POA: Diagnosis not present

## 2021-12-03 DIAGNOSIS — I10 Essential (primary) hypertension: Secondary | ICD-10-CM | POA: Diagnosis not present

## 2021-12-03 DIAGNOSIS — Z6830 Body mass index (BMI) 30.0-30.9, adult: Secondary | ICD-10-CM | POA: Diagnosis not present

## 2021-12-03 DIAGNOSIS — M545 Low back pain, unspecified: Secondary | ICD-10-CM | POA: Diagnosis not present

## 2021-12-03 DIAGNOSIS — M25512 Pain in left shoulder: Secondary | ICD-10-CM | POA: Diagnosis not present

## 2022-01-11 ENCOUNTER — Ambulatory Visit: Payer: Medicare Other | Admitting: Nurse Practitioner

## 2022-01-23 DIAGNOSIS — M810 Age-related osteoporosis without current pathological fracture: Secondary | ICD-10-CM | POA: Diagnosis not present

## 2022-01-23 DIAGNOSIS — Z85068 Personal history of other malignant neoplasm of small intestine: Secondary | ICD-10-CM | POA: Diagnosis not present

## 2022-01-23 DIAGNOSIS — I10 Essential (primary) hypertension: Secondary | ICD-10-CM | POA: Diagnosis not present

## 2022-01-23 DIAGNOSIS — Z8505 Personal history of malignant neoplasm of liver: Secondary | ICD-10-CM | POA: Diagnosis not present

## 2022-01-23 DIAGNOSIS — R1011 Right upper quadrant pain: Secondary | ICD-10-CM | POA: Diagnosis not present

## 2022-01-30 ENCOUNTER — Other Ambulatory Visit: Payer: Self-pay | Admitting: Cardiology

## 2022-01-30 DIAGNOSIS — R000 Tachycardia, unspecified: Secondary | ICD-10-CM

## 2022-01-30 DIAGNOSIS — I10 Essential (primary) hypertension: Secondary | ICD-10-CM

## 2022-02-06 ENCOUNTER — Other Ambulatory Visit: Payer: Self-pay | Admitting: Cardiology

## 2022-02-06 DIAGNOSIS — R000 Tachycardia, unspecified: Secondary | ICD-10-CM

## 2022-02-06 DIAGNOSIS — I10 Essential (primary) hypertension: Secondary | ICD-10-CM

## 2022-03-05 ENCOUNTER — Ambulatory Visit
Admission: RE | Admit: 2022-03-05 | Discharge: 2022-03-05 | Disposition: A | Discharge status: Home / Self Care | Payer: Medicare Other | Source: Ambulatory Visit | Attending: Physician Assistant | Admitting: Physician Assistant

## 2022-03-05 ENCOUNTER — Other Ambulatory Visit: Payer: Self-pay | Admitting: Physician Assistant

## 2022-03-05 DIAGNOSIS — Z9181 History of falling: Secondary | ICD-10-CM | POA: Diagnosis not present

## 2022-03-05 DIAGNOSIS — G8929 Other chronic pain: Secondary | ICD-10-CM | POA: Diagnosis not present

## 2022-03-05 DIAGNOSIS — M542 Cervicalgia: Secondary | ICD-10-CM

## 2022-03-05 DIAGNOSIS — M25512 Pain in left shoulder: Secondary | ICD-10-CM | POA: Diagnosis not present

## 2022-04-15 DIAGNOSIS — R350 Frequency of micturition: Secondary | ICD-10-CM | POA: Diagnosis not present

## 2022-04-15 DIAGNOSIS — R109 Unspecified abdominal pain: Secondary | ICD-10-CM | POA: Diagnosis not present

## 2022-04-15 DIAGNOSIS — Z1331 Encounter for screening for depression: Secondary | ICD-10-CM | POA: Diagnosis not present

## 2022-04-15 DIAGNOSIS — F5104 Psychophysiologic insomnia: Secondary | ICD-10-CM | POA: Diagnosis not present

## 2022-04-15 DIAGNOSIS — Z0000 Encounter for general adult medical examination without abnormal findings: Secondary | ICD-10-CM | POA: Diagnosis not present

## 2022-04-15 DIAGNOSIS — L659 Nonscarring hair loss, unspecified: Secondary | ICD-10-CM | POA: Diagnosis not present

## 2022-04-15 DIAGNOSIS — G8929 Other chronic pain: Secondary | ICD-10-CM | POA: Diagnosis not present

## 2022-04-15 DIAGNOSIS — I10 Essential (primary) hypertension: Secondary | ICD-10-CM | POA: Diagnosis not present

## 2022-04-15 DIAGNOSIS — M25512 Pain in left shoulder: Secondary | ICD-10-CM | POA: Diagnosis not present

## 2022-04-15 DIAGNOSIS — M810 Age-related osteoporosis without current pathological fracture: Secondary | ICD-10-CM | POA: Diagnosis not present

## 2022-04-15 DIAGNOSIS — Z85060 Personal history of malignant carcinoid tumor of small intestine: Secondary | ICD-10-CM | POA: Diagnosis not present

## 2022-04-15 DIAGNOSIS — I252 Old myocardial infarction: Secondary | ICD-10-CM | POA: Diagnosis not present

## 2022-04-15 DIAGNOSIS — R300 Dysuria: Secondary | ICD-10-CM | POA: Diagnosis not present

## 2022-04-15 DIAGNOSIS — N3941 Urge incontinence: Secondary | ICD-10-CM | POA: Diagnosis not present

## 2022-04-17 DIAGNOSIS — M6281 Muscle weakness (generalized): Secondary | ICD-10-CM | POA: Diagnosis not present

## 2022-04-17 DIAGNOSIS — M25512 Pain in left shoulder: Secondary | ICD-10-CM | POA: Diagnosis not present

## 2022-04-17 DIAGNOSIS — G8929 Other chronic pain: Secondary | ICD-10-CM | POA: Diagnosis not present

## 2022-04-24 DIAGNOSIS — M6281 Muscle weakness (generalized): Secondary | ICD-10-CM | POA: Diagnosis not present

## 2022-04-24 DIAGNOSIS — M25512 Pain in left shoulder: Secondary | ICD-10-CM | POA: Diagnosis not present

## 2022-04-24 DIAGNOSIS — G8929 Other chronic pain: Secondary | ICD-10-CM | POA: Diagnosis not present

## 2022-04-24 DIAGNOSIS — M5459 Other low back pain: Secondary | ICD-10-CM | POA: Diagnosis not present

## 2022-04-29 DIAGNOSIS — M5459 Other low back pain: Secondary | ICD-10-CM | POA: Diagnosis not present

## 2022-04-29 DIAGNOSIS — M6281 Muscle weakness (generalized): Secondary | ICD-10-CM | POA: Diagnosis not present

## 2022-04-29 DIAGNOSIS — M25512 Pain in left shoulder: Secondary | ICD-10-CM | POA: Diagnosis not present

## 2022-04-29 DIAGNOSIS — G8929 Other chronic pain: Secondary | ICD-10-CM | POA: Diagnosis not present

## 2022-05-01 DIAGNOSIS — M5459 Other low back pain: Secondary | ICD-10-CM | POA: Diagnosis not present

## 2022-05-01 DIAGNOSIS — M6281 Muscle weakness (generalized): Secondary | ICD-10-CM | POA: Diagnosis not present

## 2022-05-01 DIAGNOSIS — G8929 Other chronic pain: Secondary | ICD-10-CM | POA: Diagnosis not present

## 2022-05-01 DIAGNOSIS — M25512 Pain in left shoulder: Secondary | ICD-10-CM | POA: Diagnosis not present

## 2022-05-08 DIAGNOSIS — M25512 Pain in left shoulder: Secondary | ICD-10-CM | POA: Diagnosis not present

## 2022-05-08 DIAGNOSIS — M6281 Muscle weakness (generalized): Secondary | ICD-10-CM | POA: Diagnosis not present

## 2022-05-08 DIAGNOSIS — G8929 Other chronic pain: Secondary | ICD-10-CM | POA: Diagnosis not present

## 2022-05-08 DIAGNOSIS — M5459 Other low back pain: Secondary | ICD-10-CM | POA: Diagnosis not present

## 2022-05-13 DIAGNOSIS — I10 Essential (primary) hypertension: Secondary | ICD-10-CM | POA: Diagnosis not present

## 2022-05-13 DIAGNOSIS — R1030 Lower abdominal pain, unspecified: Secondary | ICD-10-CM | POA: Diagnosis not present

## 2022-05-13 DIAGNOSIS — R109 Unspecified abdominal pain: Secondary | ICD-10-CM | POA: Diagnosis not present

## 2022-05-13 DIAGNOSIS — Z87440 Personal history of urinary (tract) infections: Secondary | ICD-10-CM | POA: Diagnosis not present

## 2022-06-27 ENCOUNTER — Other Ambulatory Visit: Payer: Self-pay | Admitting: Cardiology

## 2022-06-27 DIAGNOSIS — I10 Essential (primary) hypertension: Secondary | ICD-10-CM

## 2022-07-01 ENCOUNTER — Encounter: Payer: Self-pay | Admitting: Cardiology

## 2022-07-01 ENCOUNTER — Ambulatory Visit: Payer: Medicare Other | Admitting: Cardiology

## 2022-07-01 VITALS — BP 143/86 | HR 83 | Temp 97.5°F | Resp 16 | Ht 63.0 in | Wt 185.4 lb

## 2022-07-01 DIAGNOSIS — I10 Essential (primary) hypertension: Secondary | ICD-10-CM | POA: Diagnosis not present

## 2022-07-01 DIAGNOSIS — R000 Tachycardia, unspecified: Secondary | ICD-10-CM

## 2022-07-01 MED ORDER — LOSARTAN POTASSIUM 25 MG PO TABS: 25.0000 mg | ORAL_TABLET | Freq: Every day | ORAL | 3 refills | Status: DC

## 2022-07-01 NOTE — Progress Notes (Signed)
Primary Physician/Referring:  Ailene Ards, NP  Patient ID: Emma Buchanan, female    DOB: 1941-04-22, 81 y.o.   MRN: [id number]  Chief Complaint  Patient presents with   Medication review   Hypertension   Follow-up   HPI:    Emma Buchanan  is a 81 y.o. 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  She presents today with questions regarding medications and elevated blood pressure. She has continued Metoprolol despite this being discontinued due to complaints of frequent urination. She continues on verapamil '180mg'$  at bedtime. She monitors her blood pressure at home and readings have been as elevated as 150/100 in right arm however, blood pressure is 130/80s in left arm.  She does endorse episodes are urinary frequency and urgency. She has not had recurrence of sinus tachycardia.   Denies chest pain, SOB, DOE, palpitations, headaches, dizziness, claudication, swelling, and abdominal pain.   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 (had surgery 2009 at [facility])   Chicken pox    Encounter for blood transfusion    GERD (gastroesophageal reflux disease)    H/O resection of liver    H/O shoulder surgery    Hepatitis A    Age 81   Hypertension    Myocardial infarction [facility])    Urinary incontinence    UTI (urinary tract infection)    Past Surgical History:  Procedure Laterality Date   ABDOMINAL HYSTERECTOMY     APPENDECTOMY     BREAST SURGERY     Biopsy   CERVICAL SPINE SURGERY     CESAREAN SECTION     CHOLECYSTECTOMY     FOREIGN BODY REMOVAL     Removal of blue vacutainer that was left behind follwing C-Section in 1962   LIVER RESECTION     SHOULDER SURGERY     SMALL INTESTINE  SURGERY     Social History   Socioeconomic History   Marital status: Widowed    Spouse name: Not on file   Number of children: 3   Years of education: Not on file   Highest education level: Not on file  Occupational History   Not on file  Tobacco Use   Smoking status: Never   Smokeless tobacco: Never  Vaping Use   Vaping Use: Never used  Substance and Sexual Activity   Alcohol use: No   Drug use: No   Sexual activity: Not on file  Other Topics Concern   Not on file  Social History Narrative   Not on file   Social Determinants of Health   Financial Resource Strain: Not on file  Food Insecurity: Not on file  Transportation Needs: Not on file  Physical Activity: Not on file  Stress: Not on file  Social Connections: Not on file  Intimate Partner Violence: Not on file   ROS  Review of Systems  Cardiovascular:  Negative for chest pain, dyspnea on exertion, leg swelling, palpitations and syncope.  Gastrointestinal:  Negative for melena.   Objective  Blood pressure (!) 143/86, pulse 83, temperature (!) 97.5 F (36.4 C), temperature source Temporal, resp. rate 16, height '5\' 3"'$  (1.6 m), weight 185 lb 6.4 oz (84.1 kg), SpO2 95 %.  07/01/2022    3:01 PM 10/11/2021    2:26 PM 07/24/2021   10:29 AM  Vitals with BMI  Height '5\' 3"'$  '5\' 3"'$  '5\' 3"'$   Weight 185 lbs 6 oz 188 lbs 10 oz 185 lbs 3 oz  BMI 32.85 61.44 31.54  Systolic 008 676 195  Diastolic 86 86 88  Pulse 83 86 108     Physical Exam Neck:     Vascular: No carotid bruit or JVD.  Cardiovascular:     Rate and Rhythm: Normal rate and regular rhythm.     Pulses: Normal pulses and intact distal pulses.     Heart sounds: Normal heart sounds. No murmur heard.    No gallop.  Pulmonary:     Effort: Pulmonary effort is normal.     Breath sounds: Normal breath sounds.  Abdominal:     General: Bowel sounds are normal.     Palpations: Abdomen is soft.  Musculoskeletal:        General: No swelling.  Neurological:      Mental Status: She is alert.    Laboratory examination:   No results for input(s): "NA", "K", "CL", "CO2", "GLUCOSE", "BUN", "CREATININE", "CALCIUM", "GFRNONAA", "GFRAA" in the last 8760 hours.  CrCl cannot be calculated (Patient's most recent lab result is older than the maximum 21 days allowed.).     Latest Ref Rng & Units 06/28/2021    1:38 PM 06/03/2019    1:52 PM 10/08/2018   12:05 PM  CMP  Glucose 70 - 99 mg/dL 88  104  89   BUN 6 - 23 mg/dL '13  28  14   '$ Creatinine 0.40 - 1.20 mg/dL 0.80  1.01  0.84   Sodium 135 - 145 mEq/L 137  142  138   Potassium 3.5 - 5.1 mEq/L 4.4  4.6  4.1   Chloride 96 - 112 mEq/L 101  105  103   CO2 19 - 32 mEq/L '29  27  26   '$ Calcium 8.4 - 10.5 mg/dL 9.7  9.7  9.3   Total Protein 6.0 - 8.3 g/dL 7.1  7.4  7.1   Total Bilirubin 0.2 - 1.2 mg/dL 0.7  0.5  0.6   Alkaline Phos 39 - 117 U/L 79  102  91   AST 0 - 37 U/L '30  27  22   '$ ALT 0 - 35 U/L '19  19  16       '$ Latest Ref Rng & Units 06/28/2021    1:38 PM 06/03/2019    1:52 PM 10/08/2018   12:05 PM  CBC  WBC 4.0 - 10.5 K/uL 8.2  9.8  7.9   Hemoglobin 12.0 - 15.0 g/dL 13.7  14.6  13.4   Hematocrit 36.0 - 46.0 % 42.3  45.6  41.4   Platelets 150.0 - 400.0 K/uL 216.0  234  206    Lipid Panel     Component Value Date/Time   CHOL 171 06/28/2021 1338   TRIG 115.0 06/28/2021 1338   HDL 66.50 06/28/2021 1338   CHOLHDL 3 06/28/2021 1338   VLDL 23.0 06/28/2021 1338   LDLCALC 82 06/28/2021 1338   HEMOGLOBIN A1C No results found for: "HGBA1C", "MPG" TSH No results for input(s): "TSH" in the last 8760 hours.   Medications and allergies   Allergies  Allergen Reactions   Duloxetine Nausea And Vomiting    Cause severe pain   Cefdinir Other (See Comments)   Meperidine Nausea And Vomiting   Meperidine Hcl  Other (See Comments)   Metoprolol Succinate [Metoprolol] Other (See Comments)    Hair loss   Nalbuphine Hcl Other (See Comments)     Current Outpatient Medications  Medication Instructions    Acetaminophen (TYLENOL 8 HOUR ARTHRITIS PAIN PO) 1 tablet, Oral, As needed   aspirin 81 mg, Oral, Daily   Cholecalciferol (VITAMIN D3 PO) 1 capsule, Oral, Daily, 2000IU   DAILY MULTIPLE VITAMINS tablet 1 tablet, Oral, Daily, Centrum silver   hydrOXYzine (ATARAX) 12.5-25 mg, Oral, At bedtime PRN   lidocaine (LIDODERM) 5 % 1 patch, Daily   loperamide (IMODIUM) 2 mg, Oral, Every 6 hours PRN   losartan (COZAAR) 25 mg, Oral, Daily   simethicone (GAS-X) 80 MG chewable tablet 1 tablet after meals and at bedtime as needed   traMADol (ULTRAM) 50 mg, Oral, 2 times daily   verapamil (CALAN-SR) 180 MG CR tablet TAKE 1 TABLET(180 MG) BY MOUTH AT BEDTIME    Radiology:  No results found.  Cardiac Studies:   Carotid artery duplex 03-08-18: Minimal atherosclerotic disease at the left carotid bulb. No significant stenosis in the internal carotid arteries. Patent vertebral arteries with antegrade flow.  Lexiscan myoview stress test 03/17/2017: 1. The resting electrocardiogram demonstrated normal sinus rhythm, normal resting conduction and nonspecific ST-T changes in the inferior and lateral leads. Stress EKG is non-diagnostic for ischemia as it a pharmacologic stress using Lexiscan. Stress symptoms included nausea and jaw pain. 2. Myocardial perfusion imaging is normal. Overall left ventricular systolic function was normal without regional wall motion abnormalities. The left ventricular ejection fraction was 72%.  PCV ECHOCARDIOGRAM COMPLETE 05/15/2021 Narrative Echocardiogram 05/15/2021: Left ventricle cavity is normal in size and wall thickness. Normal global wall motion. Normal LV systolic function with EF 68%. Doppler evidence of grade I (impaired) diastolic dysfunction, normal LAP. Mild tricuspid regurgitation. No evidence of pulmonary hypertension. No significant change compared to previous study in 2014 and 2017.  EKG:   07/01/22: Normal sinus rhythm with rate of 78 bpm, left axis. Right  bundle branch block. Compared to EKG on 06/27/21, no significant change.    Assessment     ICD-10-CM   1. Essential hypertension  I10 EKG 12-Lead    2. Sinus tachycardia  R00.0       Recommendations:   Meds ordered this encounter  Medications   losartan (COZAAR) 25 MG tablet    Sig: Take 1 tablet (25 mg total) by mouth daily.    Dispense:  90 tablet    Refill:  3    Order Specific Question:   Supervising Provider    Answer:   Adrian Prows [[id number]]   Essential hypertension In view of elevated blood pressure, we will start losartan 25 mg daily and continue verapamil 180 mg at night. Discussed low-sodium diet less than 2000 mg daily encouraged exercise as tolerated. Advised her to continue to monitor blood pressure at home and keep a log of blood pressure readings.  Sinus tachycardia She continues to have right bundle branch block but this is of no clinical consequence. She has not had recurrence of sinus tachycardia therefore will stop metoprolol because of complaints of hearing loss and urinary frequency. In view of urinary frequency, we did discuss urology referral as I do not feel this is related to medication. She would like to think about this and will speak with her PCP about referral/  She did display some challenges with short term recall and understanding medications.  We discussed medications in detail and she was provided  a list of medications. Encouraged her to discuss memory loss with primary care provider.  Follow-up in 4 weeks for blood pressure or sooner if needed.   Ernst Spell, AGNP-C 07/01/2022, 4:10 PM Office: [phone]

## 2022-07-30 ENCOUNTER — Ambulatory Visit: Payer: Medicare Other | Admitting: Cardiology

## 2022-07-30 VITALS — BP 143/91 | HR 114 | Resp 16 | Ht 63.0 in | Wt 186.0 lb

## 2022-07-30 DIAGNOSIS — Z85060 Personal history of malignant carcinoid tumor of small intestine: Secondary | ICD-10-CM

## 2022-07-30 DIAGNOSIS — I10 Essential (primary) hypertension: Secondary | ICD-10-CM

## 2022-07-30 MED ORDER — SPIRONOLACTONE-HCTZ 25-25 MG PO TABS: 1.0000 | ORAL_TABLET | ORAL | 3 refills | Status: DC

## 2022-07-30 NOTE — Progress Notes (Signed)
Primary Physician/Referring:  Johna Roles, PA  Patient ID: Emma Buchanan, female    DOB: 1941/05/14, 81 y.o.   MRN: [id number]  Chief Complaint  Patient presents with   Hypertension   Follow-up    4 weeks   HPI:    Emma Buchanan  is a 81 y.o. 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  She presents today with questions regarding medications and elevated blood pressure. She has continued Metoprolol despite this being discontinued due to complaints of frequent urination. She continues on verapamil '180mg'$  at bedtime. She monitors her blood pressure at home and readings have been as elevated as 150/100 in right arm. Denies chest pain, SOB, DOE, palpitations, headaches, dizziness, claudication, swelling, and abdominal pain.   She had presented in a very similar fashion on 07/01/2022.  No change in about history.  She is very concerned of marked fluctuation in blood pressure.  She was started on losartan on prior office visit, states that this did not help, she was again told to stop the metoprolol as she was complaining of "increased frequency of urination and decreased hearing".  Hence started metoprolol back.  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 (had surgery 2009 at [facility])   Chicken pox    Encounter for blood transfusion    GERD (gastroesophageal reflux disease)    H/O resection of liver    H/O shoulder surgery    Hepatitis A    Age 57   Hypertension    Myocardial infarction [facility])    Urinary incontinence    UTI (urinary tract infection)    Past Surgical History:  Procedure Laterality Date   ABDOMINAL HYSTERECTOMY     APPENDECTOMY     BREAST SURGERY     Biopsy    CERVICAL SPINE SURGERY     CESAREAN SECTION     CHOLECYSTECTOMY     FOREIGN BODY REMOVAL     Removal of blue vacutainer that was left behind follwing C-Section in 1962   LIVER RESECTION     SHOULDER SURGERY     SMALL INTESTINE SURGERY     Social History   Socioeconomic History   Marital status: Widowed    Spouse name: Not on file   Number of children: 3   Years of education: Not on file   Highest education level: Not on file  Occupational History   Not on file  Tobacco Use   Smoking status: Never   Smokeless tobacco: Never  Vaping Use   Vaping Use: Never used  Substance and Sexual Activity   Alcohol use: No   Drug use: No   Sexual activity: Not on file  Other Topics Concern   Not on file  Social History Narrative   Not on file   Social Determinants of Health   Financial Resource Strain: Not on file  Food Insecurity: Not on file  Transportation Needs: Not on file  Physical Activity: Not on file  Stress: Not on file  Social Connections: Not on file  Intimate Partner Violence: Not on file   ROS  Review of Systems  Cardiovascular:  Negative for chest pain, dyspnea on exertion, leg swelling, palpitations and syncope.  Gastrointestinal:  Negative for melena.   Objective  Blood pressure (!) 143/91, pulse (!) 114, resp. rate 16, height '5\' 3"'$  (1.6 m), weight 186 lb (84.4 kg), SpO2 95 %.     07/30/2022    3:26 PM 07/30/2022    3:25 PM 07/01/2022    3:01 PM  Vitals with BMI  Height  '5\' 3"'$  '5\' 3"'$   Weight  186 lbs 185 lbs 6 oz  BMI  81.15 72.62  Systolic 035 597 416  Diastolic 91 96 86  Pulse 384 124 83     Physical Exam Neck:     Vascular: No carotid bruit or JVD.  Cardiovascular:     Rate and Rhythm: Normal rate and regular rhythm.     Pulses: Normal pulses and intact distal pulses.     Heart sounds: Normal heart sounds. No murmur heard.    No gallop.  Pulmonary:     Effort: Pulmonary effort is normal.     Breath sounds: Normal breath sounds.  Abdominal:      General: Bowel sounds are normal.     Palpations: Abdomen is soft.  Musculoskeletal:        General: No swelling.  Neurological:     Mental Status: She is alert.    Laboratory examination:   No results for input(s): "NA", "K", "CL", "CO2", "GLUCOSE", "BUN", "CREATININE", "CALCIUM", "GFRNONAA", "GFRAA" in the last 8760 hours.  CrCl cannot be calculated (Patient's most recent lab result is older than the maximum 21 days allowed.).     Latest Ref Rng & Units 06/28/2021    1:38 PM 06/03/2019    1:52 PM 10/08/2018   12:05 PM  CMP  Glucose 70 - 99 mg/dL 88  104  89   BUN 6 - 23 mg/dL '13  28  14   '$ Creatinine 0.40 - 1.20 mg/dL 0.80  1.01  0.84   Sodium 135 - 145 mEq/L 137  142  138   Potassium 3.5 - 5.1 mEq/L 4.4  4.6  4.1   Chloride 96 - 112 mEq/L 101  105  103   CO2 19 - 32 mEq/L '29  27  26   '$ Calcium 8.4 - 10.5 mg/dL 9.7  9.7  9.3   Total Protein 6.0 - 8.3 g/dL 7.1  7.4  7.1   Total Bilirubin 0.2 - 1.2 mg/dL 0.7  0.5  0.6   Alkaline Phos 39 - 117 U/L 79  102  91   AST 0 - 37 U/L '30  27  22   '$ ALT 0 - 35 U/L '19  19  16       '$ Latest Ref Rng & Units 06/28/2021    1:38 PM 06/03/2019    1:52 PM 10/08/2018   12:05 PM  CBC  WBC 4.0 - 10.5 K/uL 8.2  9.8  7.9   Hemoglobin 12.0 - 15.0 g/dL 13.7  14.6  13.4   Hematocrit 36.0 - 46.0 % 42.3  45.6  41.4   Platelets 150.0 - 400.0 K/uL 216.0  234  206    Lipid Panel     Component Value Date/Time   CHOL 171 06/28/2021 1338   TRIG 115.0 06/28/2021 1338   HDL 66.50 06/28/2021 1338   CHOLHDL 3 06/28/2021 1338   VLDL 23.0 06/28/2021 1338   LDLCALC 82 06/28/2021 1338   HEMOGLOBIN A1C No results found for: "HGBA1C", "MPG" TSH No results for input(s): "TSH" in the last 8760 hours.   Medications and allergies   Allergies  Allergen Reactions   Duloxetine Nausea And Vomiting    Cause severe pain   Lisinopril Cough   Cefdinir Other (See Comments)   Meperidine Nausea And Vomiting   Meperidine Hcl Other (See Comments)   Nalbuphine Hcl  Other (See Comments)     Current Outpatient Medications:    Acetaminophen (TYLENOL 8 HOUR ARTHRITIS PAIN PO), Take 1 tablet by mouth as needed., Disp: , Rfl:    aspirin 81 MG tablet, Take 81 mg by mouth daily., Disp: , Rfl:    calcium carbonate (TUMS - DOSED IN MG ELEMENTAL CALCIUM) 500 MG chewable tablet, Chew 1 tablet by mouth daily., Disp: , Rfl:    Cholecalciferol (VITAMIN D3 PO), Take 1 capsule by mouth daily. 2000IU, Disp: , Rfl:    DAILY MULTIPLE VITAMINS tablet, Take 1 tablet by mouth daily. Centrum silver, Disp: , Rfl:    hydrOXYzine (ATARAX) 25 MG tablet, Take 0.5-1 tablets (12.5-25 mg total) by mouth at bedtime as needed., Disp: 30 tablet, Rfl: 2   lidocaine (LIDODERM) 5 %, 1 patch daily., Disp: , Rfl:    losartan (COZAAR) 25 MG tablet, Take 1 tablet (25 mg total) by mouth daily. (Patient taking differently: Take 25 mg by mouth every morning.), Disp: 90 tablet, Rfl: 3   metoprolol succinate (TOPROL-XL) 25 MG 24 hr tablet, Take 25 mg by mouth every evening., Disp: , Rfl:    omeprazole (PRILOSEC) 20 MG capsule, Take 20 mg by mouth daily., Disp: , Rfl:    simethicone (GAS-X) 80 MG chewable tablet, 1 tablet after meals and at bedtime as needed, Disp: , Rfl:    spironolactone-hydrochlorothiazide (ALDACTAZIDE) 25-25 MG tablet, Take 1 tablet by mouth every morning., Disp: 30 tablet, Rfl: 3   verapamil (CALAN-SR) 180 MG CR tablet, TAKE 1 TABLET(180 MG) BY MOUTH AT BEDTIME, Disp: 30 tablet, Rfl: 2  Radiology:   CT angiogram of the abdomen and pelvis 07/05/2020: Vascular/Lymphatic: Major arterial structures are patent. Mild for age Aortoiliac calcified atherosclerosis. Portal venous system is patent. Bilateral renal enhancement and contrast excretion is symmetric and within normal limits.  Cardiac Studies:   Carotid artery duplex March 04, 2018: Minimal atherosclerotic disease at the left carotid bulb. No significant stenosis in the internal carotid arteries. Patent vertebral arteries with  antegrade flow.  Lexiscan myoview stress test 03/17/2017: 1. The resting electrocardiogram demonstrated normal sinus rhythm, normal resting conduction and nonspecific ST-T changes in the inferior and lateral leads. Stress EKG is non-diagnostic for ischemia as it a pharmacologic stress using Lexiscan. Stress symptoms included nausea and jaw pain. 2. Myocardial perfusion imaging is normal. Overall left ventricular systolic function was normal without regional wall motion abnormalities. The left ventricular ejection fraction was 72%.  PCV ECHOCARDIOGRAM COMPLETE 05/15/2021 Narrative Echocardiogram 05/15/2021: Left ventricle cavity is normal in size and wall thickness. Normal global wall motion. Normal LV systolic function with EF 68%. Doppler evidence of grade I (impaired) diastolic dysfunction, normal LAP. Mild tricuspid regurgitation. No evidence of pulmonary hypertension. No significant change compared to previous study in 2014 and 2017.  EKG:   07/01/22: Normal sinus rhythm with rate of 78 bpm, left axis. Right bundle branch block. Compared to EKG on 06/27/21, no significant change.    Assessment     ICD-10-CM   1. Essential hypertension  I10 spironolactone-hydrochlorothiazide (ALDACTAZIDE) 25-25 MG tablet    Basic metabolic panel    Aldosterone + renin activity w/ ratio    2. H/O malignant carcinoid tumor of small intestine  Z85.060 Chromogranin A  Orders Placed This Encounter  Procedures   Basic metabolic panel   Aldosterone + renin activity w/ ratio   Chromogranin A    Meds ordered this encounter  Medications   spironolactone-hydrochlorothiazide (ALDACTAZIDE) 25-25 MG tablet    Sig: Take 1 tablet by mouth every morning.    Dispense:  30 tablet    Refill:  3    Recommendations:   Emma Buchanan is a 81 y.o. 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  She presents today with questions regarding medications and elevated blood pressure. She has continued Metoprolol despite this being discontinued due to complaints of frequent urination.  She was started on losartan on prior office visit.  1. Essential hypertension Blood pressure continues to remain markedly elevated.  Secondary cause for hypertension need to be excluded.  We will obtain aldosterone plus renin activity ratio and a BMP, I will start her on spironolactone plus hydralazine combination at 25/25 mg daily after the labs are obtained.  Patient has had CT of the abdomen and pelvis on 07/05/2020 revealing no obvious renal abnormality although it was not a dedicated vascular study.  2. H/O malignant carcinoid tumor of small intestine Patient request that I place an order for chromogranin A evaluation for prior carcinoid tumor as she is not seeing oncology anymore as she was released and deemed cured.  I will see her back in 6 weeks for follow-up.  25 minutes with the patient.   Adrian Prows, MD, [facility] 08/02/2022, 4:38 PM Office: [phone] Fax: [phone] Pager: [phone]

## 2022-07-30 NOTE — Patient Instructions (Signed)
Blood work in 2 - 3 weeks at any [employer] [facility] locations

## 2022-08-02 ENCOUNTER — Encounter: Payer: Self-pay | Admitting: Cardiology

## 2022-08-06 DIAGNOSIS — I10 Essential (primary) hypertension: Secondary | ICD-10-CM | POA: Diagnosis not present

## 2022-08-06 DIAGNOSIS — Z85060 Personal history of malignant carcinoid tumor of small intestine: Secondary | ICD-10-CM | POA: Diagnosis not present

## 2022-08-08 LAB — CHROMOGRANIN A: Chromogranin A (ng/mL): 48.4 ng/mL (ref 0.0–101.8)

## 2022-08-12 LAB — BASIC METABOLIC PANEL
BUN/Creatinine Ratio: 17 (ref 12–28)
BUN: 12 mg/dL (ref 8–27)
CO2: 23 mmol/L (ref 20–29)
Calcium: 9.3 mg/dL (ref 8.7–10.3)
Chloride: 103 mmol/L (ref 96–106)
Creatinine, Ser: 0.7 mg/dL (ref 0.57–1.00)
Glucose: 83 mg/dL (ref 70–99)
Potassium: 4.6 mmol/L (ref 3.5–5.2)
Sodium: 142 mmol/L (ref 134–144)
eGFR: 87 mL/min/{1.73_m2} (ref >59)

## 2022-08-12 LAB — ALDOSTERONE + RENIN ACTIVITY W/ RATIO
Aldos/Renin Ratio: 10.9 (ref 0.0–30.0)
Aldosterone: 9.7 ng/dL (ref 0.0–30.0)
Renin Activity, Plasma: 0.887 ng/mL/hr (ref 0.167–5.380)

## 2022-08-27 ENCOUNTER — Other Ambulatory Visit: Payer: Self-pay | Admitting: Cardiology

## 2022-08-27 DIAGNOSIS — I10 Essential (primary) hypertension: Secondary | ICD-10-CM

## 2022-08-27 DIAGNOSIS — R000 Tachycardia, unspecified: Secondary | ICD-10-CM

## 2022-09-10 ENCOUNTER — Ambulatory Visit: Payer: TRICARE For Life (TFL) | Admitting: Cardiology

## 2022-09-24 ENCOUNTER — Other Ambulatory Visit: Payer: Self-pay | Admitting: Cardiology

## 2022-09-24 ENCOUNTER — Ambulatory Visit: Payer: Medicare Other | Admitting: Cardiology

## 2022-09-24 ENCOUNTER — Encounter: Payer: Self-pay | Admitting: Cardiology

## 2022-09-24 VITALS — BP 134/87 | HR 99 | Resp 16 | Ht 63.0 in | Wt 188.0 lb

## 2022-09-24 DIAGNOSIS — R000 Tachycardia, unspecified: Secondary | ICD-10-CM | POA: Diagnosis not present

## 2022-09-24 DIAGNOSIS — I10 Essential (primary) hypertension: Secondary | ICD-10-CM

## 2022-09-24 MED ORDER — VERAPAMIL HCL ER 240 MG PO TBCR: 240.0000 mg | EXTENDED_RELEASE_TABLET | Freq: Every day | ORAL | 3 refills | Status: AC

## 2022-09-24 MED ORDER — SPIRONOLACTONE-HCTZ 25-25 MG PO TABS: 1.0000 | ORAL_TABLET | ORAL | 3 refills | Status: AC
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
## 2022-09-30 DIAGNOSIS — R350 Frequency of micturition: Secondary | ICD-10-CM | POA: Diagnosis not present

## 2022-09-30 DIAGNOSIS — I10 Essential (primary) hypertension: Secondary | ICD-10-CM | POA: Diagnosis not present

## 2022-09-30 DIAGNOSIS — R1011 Right upper quadrant pain: Secondary | ICD-10-CM | POA: Diagnosis not present

## 2022-09-30 DIAGNOSIS — G8929 Other chronic pain: Secondary | ICD-10-CM | POA: Diagnosis not present

## 2022-10-09 DIAGNOSIS — I10 Essential (primary) hypertension: Secondary | ICD-10-CM | POA: Diagnosis not present

## 2022-10-09 DIAGNOSIS — M25512 Pain in left shoulder: Secondary | ICD-10-CM | POA: Diagnosis not present

## 2022-10-09 DIAGNOSIS — G8929 Other chronic pain: Secondary | ICD-10-CM | POA: Diagnosis not present

## 2022-10-23 DIAGNOSIS — D72819 Decreased white blood cell count, unspecified: Secondary | ICD-10-CM | POA: Diagnosis not present

## 2022-10-23 DIAGNOSIS — R300 Dysuria: Secondary | ICD-10-CM | POA: Diagnosis not present

## 2022-10-30 ENCOUNTER — Ambulatory Visit: Payer: Self-pay | Admitting: Family Medicine

## 2022-11-01 DIAGNOSIS — S0012XA Contusion of left eyelid and periocular area, initial encounter: Secondary | ICD-10-CM | POA: Diagnosis not present

## 2022-11-01 DIAGNOSIS — Z9181 History of falling: Secondary | ICD-10-CM | POA: Diagnosis not present

## 2022-11-01 DIAGNOSIS — S0003XA Contusion of scalp, initial encounter: Secondary | ICD-10-CM | POA: Diagnosis not present

## 2022-11-23 ENCOUNTER — Other Ambulatory Visit: Payer: Self-pay | Admitting: Cardiology

## 2022-11-23 DIAGNOSIS — R000 Tachycardia, unspecified: Secondary | ICD-10-CM

## 2022-11-23 DIAGNOSIS — I10 Essential (primary) hypertension: Secondary | ICD-10-CM

## 2022-12-16 DIAGNOSIS — R300 Dysuria: Secondary | ICD-10-CM | POA: Diagnosis not present

## 2023-01-02 ENCOUNTER — Other Ambulatory Visit: Payer: Self-pay | Admitting: Physician Assistant

## 2023-01-02 ENCOUNTER — Ambulatory Visit
Admission: RE | Admit: 2023-01-02 | Discharge: 2023-01-02 | Disposition: A | Discharge status: Home / Self Care | Payer: Medicare Other | Source: Ambulatory Visit | Attending: Physician Assistant | Admitting: Physician Assistant

## 2023-01-02 DIAGNOSIS — R030 Elevated blood-pressure reading, without diagnosis of hypertension: Secondary | ICD-10-CM | POA: Diagnosis not present

## 2023-01-02 DIAGNOSIS — M25511 Pain in right shoulder: Secondary | ICD-10-CM

## 2023-01-02 DIAGNOSIS — R1031 Right lower quadrant pain: Secondary | ICD-10-CM | POA: Diagnosis not present

## 2023-01-02 DIAGNOSIS — G8929 Other chronic pain: Secondary | ICD-10-CM | POA: Diagnosis not present

## 2023-01-02 DIAGNOSIS — Z9181 History of falling: Secondary | ICD-10-CM | POA: Diagnosis not present

## 2023-01-23 DIAGNOSIS — I10 Essential (primary) hypertension: Secondary | ICD-10-CM | POA: Diagnosis not present

## 2023-02-12 DIAGNOSIS — G8929 Other chronic pain: Secondary | ICD-10-CM | POA: Diagnosis not present

## 2023-02-12 DIAGNOSIS — I10 Essential (primary) hypertension: Secondary | ICD-10-CM | POA: Diagnosis not present

## 2023-02-12 DIAGNOSIS — M25512 Pain in left shoulder: Secondary | ICD-10-CM | POA: Diagnosis not present

## 2023-02-12 DIAGNOSIS — Z9181 History of falling: Secondary | ICD-10-CM | POA: Diagnosis not present

## 2023-03-12 DIAGNOSIS — M542 Cervicalgia: Secondary | ICD-10-CM | POA: Diagnosis not present

## 2023-03-12 DIAGNOSIS — M25512 Pain in left shoulder: Secondary | ICD-10-CM | POA: Diagnosis not present

## 2023-06-25 DIAGNOSIS — Z1331 Encounter for screening for depression: Secondary | ICD-10-CM | POA: Diagnosis not present

## 2023-06-25 DIAGNOSIS — I252 Old myocardial infarction: Secondary | ICD-10-CM | POA: Diagnosis not present

## 2023-06-25 DIAGNOSIS — G8929 Other chronic pain: Secondary | ICD-10-CM | POA: Diagnosis not present

## 2023-06-25 DIAGNOSIS — I10 Essential (primary) hypertension: Secondary | ICD-10-CM | POA: Diagnosis not present

## 2023-06-25 DIAGNOSIS — Z13220 Encounter for screening for lipoid disorders: Secondary | ICD-10-CM | POA: Diagnosis not present

## 2023-06-25 DIAGNOSIS — R413 Other amnesia: Secondary | ICD-10-CM | POA: Diagnosis not present

## 2023-06-25 DIAGNOSIS — F5104 Psychophysiologic insomnia: Secondary | ICD-10-CM | POA: Diagnosis not present

## 2023-06-25 DIAGNOSIS — K219 Gastro-esophageal reflux disease without esophagitis: Secondary | ICD-10-CM | POA: Diagnosis not present

## 2023-06-25 DIAGNOSIS — Z136 Encounter for screening for cardiovascular disorders: Secondary | ICD-10-CM | POA: Diagnosis not present

## 2023-06-25 DIAGNOSIS — Z0000 Encounter for general adult medical examination without abnormal findings: Secondary | ICD-10-CM | POA: Diagnosis not present

## 2023-06-25 DIAGNOSIS — M810 Age-related osteoporosis without current pathological fracture: Secondary | ICD-10-CM | POA: Diagnosis not present

## 2023-06-26 ENCOUNTER — Other Ambulatory Visit: Payer: Self-pay | Admitting: Physician Assistant

## 2023-06-26 DIAGNOSIS — M810 Age-related osteoporosis without current pathological fracture: Secondary | ICD-10-CM

## 2023-06-27 ENCOUNTER — Ambulatory Visit
Admission: RE | Admit: 2023-06-27 | Discharge: 2023-06-27 | Disposition: A | Discharge status: Home / Self Care | Payer: Medicare Other | Source: Ambulatory Visit | Attending: Physician Assistant | Admitting: Physician Assistant

## 2023-06-27 DIAGNOSIS — E349 Endocrine disorder, unspecified: Secondary | ICD-10-CM | POA: Diagnosis not present

## 2023-06-27 DIAGNOSIS — M810 Age-related osteoporosis without current pathological fracture: Secondary | ICD-10-CM

## 2023-06-27 DIAGNOSIS — M8588 Other specified disorders of bone density and structure, other site: Secondary | ICD-10-CM | POA: Diagnosis not present

## 2023-06-27 DIAGNOSIS — N958 Other specified menopausal and perimenopausal disorders: Secondary | ICD-10-CM | POA: Diagnosis not present

## 2023-07-04 DIAGNOSIS — R300 Dysuria: Secondary | ICD-10-CM | POA: Diagnosis not present

## 2023-07-25 DIAGNOSIS — M62838 Other muscle spasm: Secondary | ICD-10-CM | POA: Diagnosis not present

## 2023-07-25 DIAGNOSIS — R1110 Vomiting, unspecified: Secondary | ICD-10-CM | POA: Diagnosis not present

## 2023-07-25 DIAGNOSIS — R300 Dysuria: Secondary | ICD-10-CM | POA: Diagnosis not present

## 2023-08-12 ENCOUNTER — Encounter (HOSPITAL_COMMUNITY): Payer: Self-pay | Admitting: Physician Assistant

## 2023-08-12 ENCOUNTER — Ambulatory Visit (HOSPITAL_COMMUNITY)
Admission: RE | Admit: 2023-08-12 | Discharge: 2023-08-12 | Disposition: A | Discharge status: Home / Self Care | Payer: No Typology Code available for payment source | Source: Ambulatory Visit | Attending: Physician Assistant | Admitting: Physician Assistant

## 2023-08-12 ENCOUNTER — Other Ambulatory Visit (HOSPITAL_COMMUNITY): Payer: Self-pay | Admitting: Physician Assistant

## 2023-08-12 DIAGNOSIS — R519 Headache, unspecified: Secondary | ICD-10-CM | POA: Diagnosis not present

## 2023-08-12 DIAGNOSIS — H9311 Tinnitus, right ear: Secondary | ICD-10-CM | POA: Insufficient documentation

## 2023-08-12 DIAGNOSIS — H5500 Unspecified nystagmus: Secondary | ICD-10-CM | POA: Insufficient documentation

## 2023-08-12 DIAGNOSIS — H9190 Unspecified hearing loss, unspecified ear: Secondary | ICD-10-CM

## 2023-08-12 DIAGNOSIS — S0990XA Unspecified injury of head, initial encounter: Secondary | ICD-10-CM | POA: Diagnosis not present

## 2023-09-05 DIAGNOSIS — H903 Sensorineural hearing loss, bilateral: Secondary | ICD-10-CM | POA: Diagnosis not present

## 2023-09-22 DIAGNOSIS — H903 Sensorineural hearing loss, bilateral: Secondary | ICD-10-CM | POA: Diagnosis not present

## 2023-12-01 DIAGNOSIS — R300 Dysuria: Secondary | ICD-10-CM | POA: Diagnosis not present

## 2023-12-01 DIAGNOSIS — N3941 Urge incontinence: Secondary | ICD-10-CM | POA: Diagnosis not present

## 2023-12-04 DIAGNOSIS — H903 Sensorineural hearing loss, bilateral: Secondary | ICD-10-CM | POA: Diagnosis not present

## 2023-12-24 DIAGNOSIS — N3941 Urge incontinence: Secondary | ICD-10-CM | POA: Diagnosis not present

## 2023-12-24 DIAGNOSIS — I10 Essential (primary) hypertension: Secondary | ICD-10-CM | POA: Diagnosis not present

## 2023-12-24 DIAGNOSIS — R413 Other amnesia: Secondary | ICD-10-CM | POA: Diagnosis not present

## 2023-12-24 DIAGNOSIS — M25512 Pain in left shoulder: Secondary | ICD-10-CM | POA: Diagnosis not present

## 2024-01-26 ENCOUNTER — Ambulatory Visit: Payer: Self-pay
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
## 2024-02-02 ENCOUNTER — Ambulatory Visit: Payer: Self-pay | Admitting: Internal Medicine

## 2024-02-04 ENCOUNTER — Encounter (HOSPITAL_COMMUNITY): Payer: Self-pay | Admitting: *Deleted

## 2024-02-04 ENCOUNTER — Emergency Department (HOSPITAL_COMMUNITY)
Admission: EM | Admit: 2024-02-04 | Discharge: 2024-02-04 | Disposition: A | Discharge status: Home / Self Care | Attending: Emergency Medicine | Admitting: Emergency Medicine

## 2024-02-04 ENCOUNTER — Other Ambulatory Visit: Payer: Self-pay

## 2024-02-04 ENCOUNTER — Emergency Department (HOSPITAL_COMMUNITY)

## 2024-02-04 DIAGNOSIS — D72829 Elevated white blood cell count, unspecified: Secondary | ICD-10-CM | POA: Diagnosis not present

## 2024-02-04 DIAGNOSIS — I10 Essential (primary) hypertension: Secondary | ICD-10-CM | POA: Insufficient documentation

## 2024-02-04 DIAGNOSIS — Z96611 Presence of right artificial shoulder joint: Secondary | ICD-10-CM | POA: Diagnosis not present

## 2024-02-04 DIAGNOSIS — R42 Dizziness and giddiness: Secondary | ICD-10-CM | POA: Diagnosis not present

## 2024-02-04 DIAGNOSIS — Z7982 Long term (current) use of aspirin: Secondary | ICD-10-CM | POA: Insufficient documentation

## 2024-02-04 DIAGNOSIS — Z85028 Personal history of other malignant neoplasm of stomach: Secondary | ICD-10-CM | POA: Diagnosis not present

## 2024-02-04 DIAGNOSIS — Z79899 Other long term (current) drug therapy: Secondary | ICD-10-CM | POA: Insufficient documentation

## 2024-02-04 DIAGNOSIS — R55 Syncope and collapse: Secondary | ICD-10-CM | POA: Diagnosis not present

## 2024-02-04 DIAGNOSIS — R079 Chest pain, unspecified: Secondary | ICD-10-CM | POA: Diagnosis not present

## 2024-02-04 LAB — BASIC METABOLIC PANEL WITH GFR
Anion gap: 11 (ref 5–15)
BUN: 17 mg/dL (ref 8–23)
CO2: 21 mmol/L — ABNORMAL LOW (ref 22–32)
Calcium: 9.3 mg/dL (ref 8.9–10.3)
Chloride: 104 mmol/L (ref 98–111)
Creatinine, Ser: 1.09 mg/dL — ABNORMAL HIGH (ref 0.44–1.00)
GFR, Estimated: 51 mL/min — ABNORMAL LOW (ref >60)
Glucose, Bld: 116 mg/dL — ABNORMAL HIGH (ref 70–99)
Potassium: 4.5 mmol/L (ref 3.5–5.1)
Sodium: 136 mmol/L (ref 135–145)

## 2024-02-04 LAB — CBC WITH DIFFERENTIAL/PLATELET
Abs Immature Granulocytes: 0.04 10*3/uL (ref 0.00–0.07)
Basophils Absolute: 0 10*3/uL (ref 0.0–0.1)
Basophils Relative: 0 %
Eosinophils Absolute: 0.1 10*3/uL (ref 0.0–0.5)
Eosinophils Relative: 1 %
HCT: 44.8 % (ref 36.0–46.0)
Hemoglobin: 14.5 g/dL (ref 12.0–15.0)
Immature Granulocytes: 0 %
Lymphocytes Relative: 23 %
Lymphs Abs: 2.9 10*3/uL (ref 0.7–4.0)
MCH: 29.7 pg (ref 26.0–34.0)
MCHC: 32.4 g/dL (ref 30.0–36.0)
MCV: 91.8 fL (ref 80.0–100.0)
Monocytes Absolute: 1 10*3/uL (ref 0.1–1.0)
Monocytes Relative: 8 %
Neutro Abs: 8.5 10*3/uL — ABNORMAL HIGH (ref 1.7–7.7)
Neutrophils Relative %: 68 %
Platelets: 253 10*3/uL (ref 150–400)
RBC: 4.88 MIL/uL (ref 3.87–5.11)
RDW: 13.4 % (ref 11.5–15.5)
WBC: 12.6 10*3/uL — ABNORMAL HIGH (ref 4.0–10.5)
nRBC: 0 % (ref 0.0–0.2)

## 2024-02-04 LAB — CBG MONITORING, ED: Glucose-Capillary: 119 mg/dL — ABNORMAL HIGH (ref 70–99)

## 2024-02-04 LAB — TROPONIN I (HIGH SENSITIVITY)
Troponin I (High Sensitivity): 11 ng/L (ref <18)
Troponin I (High Sensitivity): 11 ng/L (ref <18)

## 2024-02-04 NOTE — ED Triage Notes (Signed)
 The pt has a near syncope this am feeling weak and she checked her bp at that time and it was very low

## 2024-02-04 NOTE — ED Notes (Signed)
 CCMD called.

## 2024-02-04 NOTE — ED Provider Triage Note (Signed)
 Emergency Medicine Provider Triage Evaluation Note  Emma Buchanan , a 83 y.o. female  was evaluated in triage.  Pt complains of near syncopal episode this morning.  Left shoulder and chest pain.  Symptoms mostly resolved at this time.  Hypotension "20/40s"  Review of Systems  Positive: Near syncope, left shoulder/chest pain. Negative: Symptoms at this time, shortness of breath, head injury, nausea, vomiting, abdominal pain  Physical Exam  BP 137/81 (BP Location: Right Arm)   Pulse (!) 132   Temp 97.9 F (36.6 C)   Resp 18   SpO2 95%  Gen:   Awake, no distress   Resp:  Normal effort  MSK:   Moves extremities without difficulty  Other:    Medical Decision Making  Medically screening exam initiated at 7:15 PM.  Appropriate orders placed.  Emma Buchanan was informed that the remainder of the evaluation will be completed by another provider, this initial triage assessment does not replace that evaluation, and the importance of remaining in the ED until their evaluation is complete.  Labs/imaging ordered   Carie Charity, PA-C 02/04/24 1925

## 2024-02-04 NOTE — ED Provider Notes (Signed)
 [facility] Provider Note   CSN: [id number] Arrival date & time: 02/04/24  1843     History  Chief Complaint  Patient presents with   Near Syncope    Emma Buchanan is a 83 y.o. female.  The history is provided by the patient and medical records.  Near Syncope   83 y.o. F with hx of rosacea, anxiety, GERD, hx of stomach cancer s/p resection and treatment, HTN, chronic left shoulder issues, presenting to the ED after near syncopal event.  Patient states at baseline she has urinary and fecal incontinence.  Over the past few days she has noticed increase in stool frequency.  States they seem loose and almost "grayish" in color.  Denies any bloody stools.  No abdominal pain.  Today while at her refrigerator she began feeling lightheaded.  States she never lost consciousness, went and sat down.  She did check her BP and noted it to be "very low" but admits she does not feel it was accurate.  States she drank some of her sons gatorade and some sparkling water and began feeling better.  Admits she has felt a little dehydrated recently from the increased stools. She did call her PCP and encouraged ER evaluation just to make sur everything was ok.  She denies any chest pain or SOB.  Triage note indicates chest pain but reports only left shoulder which is chronic after multiple surgeries. Has been able to get up and walk around while here in the ED since incident at home and is no longer feeling lightheaded when doing so.  Home Medications Prior to Admission medications   Medication Sig Start Date End Date Taking? Authorizing Provider  Acetaminophen (TYLENOL 8 HOUR ARTHRITIS PAIN PO) Take 1 tablet by mouth as needed.    [provider]  aspirin 81 MG tablet Take 81 mg by mouth daily.    [provider]  calcium carbonate (TUMS - DOSED IN MG ELEMENTAL CALCIUM) 500 MG chewable tablet Chew 1 tablet by mouth daily.    [provider]  Cholecalciferol (VITAMIN D3 PO) Take 1 capsule by mouth daily. 2000IU    [provider]  DAILY MULTIPLE VITAMINS tablet Take 1 tablet by mouth daily. Centrum silver    [provider]  hydrOXYzine  (ATARAX ) 25 MG tablet Take 0.5-1 tablets (12.5-25 mg total) by mouth at bedtime as needed. 10/11/21   Gray, Sarah E, NP  lidocaine (LIDODERM) 5 % 1 patch daily. Patient not taking: Reported on 09/24/2022 09/14/19   [provider]  omeprazole (PRILOSEC) 20 MG capsule Take 20 mg by mouth daily.    [provider]  simethicone (GAS-X) 80 MG chewable tablet 1 tablet after meals and at bedtime as needed    [provider]  spironolactone -hydrochlorothiazide (ALDACTAZIDE) 25-25 MG tablet Take 1 tablet by mouth every morning. 09/24/22   Knox Perl, MD  verapamil  (CALAN -SR) 240 MG CR tablet Take 1 tablet (240 mg total) by mouth at bedtime. 09/24/22   Knox Perl, MD      Allergies    Duloxetine, Lisinopril, Cefdinir, Meperidine, Meperidine hcl, and Nalbuphine hcl    Review of Systems   Review of Systems  Cardiovascular:  Positive for near-syncope.  Neurological:  Positive for light-headedness.  All other systems reviewed and are negative.   Physical Exam Updated Vital Signs BP 135/86   Pulse (!) 109   Temp 97.9 F (36.6 C)   Resp 19  Ht 5\' 3"  (1.6 m)   Wt 85.3 kg   SpO2 99%   BMI 33.31 kg/m   Physical Exam Vitals and nursing note reviewed.  Constitutional:      Appearance: She is well-developed.  HENT:     Head: Normocephalic and atraumatic.  Eyes:     Conjunctiva/sclera: Conjunctivae normal.     Pupils: Pupils are equal, round, and reactive to light.  Cardiovascular:     Rate and Rhythm: Normal rate and regular rhythm.     Heart sounds: Normal heart sounds.  Pulmonary:     Effort: Pulmonary effort is normal.     Breath sounds: Normal breath sounds.  Abdominal:     General: Bowel sounds are normal.     Palpations: Abdomen is soft.      Tenderness: There is no abdominal tenderness. There is no rebound.  Musculoskeletal:        General: Normal range of motion.     Cervical back: Normal range of motion.  Skin:    General: Skin is warm and dry.  Neurological:     Mental Status: She is alert and oriented to person, place, and time.     ED Results / Procedures / Treatments   Labs (all labs ordered are listed, but only abnormal results are displayed) Labs Reviewed  BASIC METABOLIC PANEL WITH GFR - Abnormal; Notable for the following components:      Result Value   CO2 21 (*)    Glucose, Bld 116 (*)    Creatinine, Ser 1.09 (*)    GFR, Estimated 51 (*)    All other components within normal limits  CBC WITH DIFFERENTIAL/PLATELET - Abnormal; Notable for the following components:   WBC 12.6 (*)    Neutro Abs 8.5 (*)    All other components within normal limits  CBG MONITORING, ED - Abnormal; Notable for the following components:   Glucose-Capillary 119 (*)    All other components within normal limits  TROPONIN I (HIGH SENSITIVITY)  TROPONIN I (HIGH SENSITIVITY)    EKG EKG Interpretation Date/Time:  Wednesday Feb 04 2024 21:32:45 EDT Ventricular Rate:  111 PR Interval:  150 QRS Duration:  139 QT Interval:  347 QTC Calculation: 472 R Axis:   19  Text Interpretation: Sinus tachycardia Right bundle branch block No previous tracing Confirmed by Almond Army ([id number]) on 02/04/2024 9:45:44 PM  Radiology DG Chest 2 View Result Date: 02/04/2024 CLINICAL DATA:  Chest pain EXAM: CHEST - 2 VIEW COMPARISON:  None Available. FINDINGS: Lungs are well expanded and are clear. Mild eventration of the right hemidiaphragm. No pneumothorax or pleural effusion. Cardiac size within normal limits. Pulmonary vascularity is normal. Osseous structures are diffusely osteopenic. Right total shoulder arthroplasty noted. No acute bone abnormality. IMPRESSION: 1. No active cardiopulmonary disease. Electronically Signed   By: Worthy Heads  M.D.   On: 02/04/2024 20:16    Procedures Procedures    Medications Ordered in ED Medications - No data to display  ED Course/ Medical Decision Making/ A&P                                 Medical Decision Making  83 year old female here after episode of near syncope.  Has been having some loose stools over the past few days.  Denies any bloody stools.  Denies any abdominal pain, chest pain, or shortness of breath.  She is afebrile and nontoxic in appearance here.  By time of my evaluation she is completely asymptomatic.  Her abdomen is soft and nontender.  Lungs are clear without any wheezes or rhonchi.  She was initially tachycardic on arrival but heart rate has down trended to within normal limits without acute intervention.  EKG sinus tach.  Labs with leukocytosis, stable hemoglobin.  Renal function is slightly above baseline, no electrolyte derangement.  Troponin negative x2.    As patient has recently had some loose stools, suspect she may have some [employer] of dehydration.  She does report feeling better at home after drinking Gatorade and seltzer water.  I have offered her IV fluids here but she declined as she would just prefer to go home.  She has been able to get up and ambulate without issue, no further lightheadedness.  Her blood pressure has been stable here.  She is not having any chest pain, shortness of breath, or abdominal pain.  Feel she is stable for discharge.  Have encouraged her to hydrate well over the next few days, close follow-up with PCP-- has an appt in 2 days for follow-up already.  Return here for new concerns.  Final Clinical Impression(s) / ED Diagnoses Final diagnoses:  Lightheadedness    Rx / DC Orders ED Discharge Orders     None         Coretha Dew, PA-C 02/05/24 0003    Almond Army, MD 02/05/24 2102

## 2024-02-04 NOTE — Discharge Instructions (Signed)
 Follow-up with your doctor on Friday as scheduled. Make sure to hydrate well. Return here for any new/acute changes.

## 2024-02-05 ENCOUNTER — Ambulatory Visit: Admitting: Internal Medicine

## 2024-02-06 DIAGNOSIS — M549 Dorsalgia, unspecified: Secondary | ICD-10-CM | POA: Diagnosis not present

## 2024-02-06 DIAGNOSIS — R350 Frequency of micturition: Secondary | ICD-10-CM | POA: Diagnosis not present

## 2024-03-22 DIAGNOSIS — N390 Urinary tract infection, site not specified: Secondary | ICD-10-CM | POA: Diagnosis not present

## 2024-03-22 DIAGNOSIS — R300 Dysuria: Secondary | ICD-10-CM | POA: Diagnosis not present

## 2024-06-10 DIAGNOSIS — H903 Sensorineural hearing loss, bilateral: Secondary | ICD-10-CM | POA: Diagnosis not present

## 2024-06-29 DIAGNOSIS — N3946 Mixed incontinence: Secondary | ICD-10-CM | POA: Diagnosis not present

## 2024-06-29 DIAGNOSIS — M810 Age-related osteoporosis without current pathological fracture: Secondary | ICD-10-CM | POA: Diagnosis not present

## 2024-06-29 DIAGNOSIS — Z0000 Encounter for general adult medical examination without abnormal findings: Secondary | ICD-10-CM | POA: Diagnosis not present

## 2024-06-29 DIAGNOSIS — Z1389 Encounter for screening for other disorder: Secondary | ICD-10-CM | POA: Diagnosis not present

## 2024-06-29 DIAGNOSIS — Z8505 Personal history of malignant neoplasm of liver: Secondary | ICD-10-CM | POA: Diagnosis not present

## 2024-06-29 DIAGNOSIS — I10 Essential (primary) hypertension: Secondary | ICD-10-CM | POA: Diagnosis not present

## 2024-06-29 DIAGNOSIS — Z1339 Encounter for screening examination for other mental health and behavioral disorders: Secondary | ICD-10-CM | POA: Diagnosis not present

## 2024-06-29 DIAGNOSIS — G894 Chronic pain syndrome: Secondary | ICD-10-CM | POA: Diagnosis not present

## 2024-06-29 DIAGNOSIS — Z85068 Personal history of other malignant neoplasm of small intestine: Secondary | ICD-10-CM | POA: Diagnosis not present

## 2024-08-16 DIAGNOSIS — R32 Unspecified urinary incontinence: Secondary | ICD-10-CM | POA: Diagnosis not present

## 2024-08-16 DIAGNOSIS — I10 Essential (primary) hypertension: Secondary | ICD-10-CM | POA: Diagnosis not present

## 2024-08-16 DIAGNOSIS — G8929 Other chronic pain: Secondary | ICD-10-CM | POA: Diagnosis not present

## 2024-08-16 DIAGNOSIS — R0781 Pleurodynia: Secondary | ICD-10-CM | POA: Diagnosis not present

## 2024-08-16 DIAGNOSIS — M25512 Pain in left shoulder: Secondary | ICD-10-CM | POA: Diagnosis not present

## 2024-08-17 ENCOUNTER — Ambulatory Visit
Admission: RE | Admit: 2024-08-17 | Discharge: 2024-08-17 | Disposition: A | Discharge status: Home / Self Care | Source: Ambulatory Visit | Attending: Physician Assistant | Admitting: Physician Assistant

## 2024-08-17 ENCOUNTER — Other Ambulatory Visit: Payer: Self-pay | Admitting: Physician Assistant

## 2024-08-17 DIAGNOSIS — M25512 Pain in left shoulder: Secondary | ICD-10-CM

## 2024-08-17 DIAGNOSIS — Z471 Aftercare following joint replacement surgery: Secondary | ICD-10-CM | POA: Diagnosis not present

## 2024-08-17 DIAGNOSIS — Z96612 Presence of left artificial shoulder joint: Secondary | ICD-10-CM | POA: Diagnosis not present
